# Patient Record
Sex: Male | Born: 1995 | Race: White | Hispanic: No | Marital: Single | Smoking: Current some day smoker
Health system: Southern US, Community
[De-identification: ages and names within clinical notes are randomized; demographics above are authoritative.]

## PROBLEM LIST (undated history)

## (undated) DIAGNOSIS — N200 Calculus of kidney: Secondary | ICD-10-CM

## (undated) DIAGNOSIS — Z87442 Personal history of urinary calculi: Secondary | ICD-10-CM

---

## 2018-09-17 ENCOUNTER — Encounter (HOSPITAL_COMMUNITY): Payer: Self-pay | Admitting: *Deleted

## 2018-09-17 ENCOUNTER — Inpatient Hospital Stay (HOSPITAL_COMMUNITY)
Admission: EM | Admit: 2018-09-17 | Discharge: 2018-09-20 | DRG: 197 | Disposition: A | Discharge status: Home / Self Care | Payer: Self-pay | Attending: Internal Medicine | Admitting: Internal Medicine

## 2018-09-17 ENCOUNTER — Emergency Department (HOSPITAL_COMMUNITY): Payer: Self-pay

## 2018-09-17 ENCOUNTER — Other Ambulatory Visit: Payer: Self-pay

## 2018-09-17 DIAGNOSIS — R0789 Other chest pain: Secondary | ICD-10-CM | POA: Diagnosis present

## 2018-09-17 DIAGNOSIS — E876 Hypokalemia: Secondary | ICD-10-CM | POA: Diagnosis present

## 2018-09-17 DIAGNOSIS — F1411 Cocaine abuse, in remission: Secondary | ICD-10-CM | POA: Diagnosis present

## 2018-09-17 DIAGNOSIS — Z8249 Family history of ischemic heart disease and other diseases of the circulatory system: Secondary | ICD-10-CM

## 2018-09-17 DIAGNOSIS — R042 Hemoptysis: Secondary | ICD-10-CM | POA: Diagnosis present

## 2018-09-17 DIAGNOSIS — K920 Hematemesis: Secondary | ICD-10-CM | POA: Diagnosis present

## 2018-09-17 DIAGNOSIS — Z590 Homelessness: Secondary | ICD-10-CM

## 2018-09-17 DIAGNOSIS — R768 Other specified abnormal immunological findings in serum: Secondary | ICD-10-CM | POA: Diagnosis present

## 2018-09-17 DIAGNOSIS — F1990 Other psychoactive substance use, unspecified, uncomplicated: Secondary | ICD-10-CM | POA: Diagnosis present

## 2018-09-17 DIAGNOSIS — F17200 Nicotine dependence, unspecified, uncomplicated: Secondary | ICD-10-CM | POA: Diagnosis present

## 2018-09-17 DIAGNOSIS — W548XXA Other contact with dog, initial encounter: Secondary | ICD-10-CM

## 2018-09-17 DIAGNOSIS — F1290 Cannabis use, unspecified, uncomplicated: Secondary | ICD-10-CM | POA: Diagnosis present

## 2018-09-17 DIAGNOSIS — F1310 Sedative, hypnotic or anxiolytic abuse, uncomplicated: Secondary | ICD-10-CM | POA: Diagnosis present

## 2018-09-17 DIAGNOSIS — J849 Interstitial pulmonary disease, unspecified: Principal | ICD-10-CM | POA: Diagnosis present

## 2018-09-17 DIAGNOSIS — F1910 Other psychoactive substance abuse, uncomplicated: Secondary | ICD-10-CM | POA: Diagnosis present

## 2018-09-17 DIAGNOSIS — R7989 Other specified abnormal findings of blood chemistry: Secondary | ICD-10-CM | POA: Diagnosis present

## 2018-09-17 DIAGNOSIS — R0602 Shortness of breath: Secondary | ICD-10-CM | POA: Diagnosis present

## 2018-09-17 DIAGNOSIS — R300 Dysuria: Secondary | ICD-10-CM | POA: Diagnosis present

## 2018-09-17 DIAGNOSIS — R740 Nonspecific elevation of levels of transaminase and lactic acid dehydrogenase [LDH]: Secondary | ICD-10-CM | POA: Diagnosis present

## 2018-09-17 DIAGNOSIS — Z87442 Personal history of urinary calculi: Secondary | ICD-10-CM

## 2018-09-17 DIAGNOSIS — F1110 Opioid abuse, uncomplicated: Secondary | ICD-10-CM | POA: Diagnosis present

## 2018-09-17 DIAGNOSIS — Y92481 Parking lot as the place of occurrence of the external cause: Secondary | ICD-10-CM

## 2018-09-17 DIAGNOSIS — Y939 Activity, unspecified: Secondary | ICD-10-CM

## 2018-09-17 HISTORY — DX: Calculus of kidney: N20.0

## 2018-09-17 HISTORY — DX: Personal history of urinary calculi: Z87.442

## 2018-09-17 MED ORDER — SODIUM CHLORIDE 0.9% FLUSH: 3.0000 mL | Freq: Once | INTRAVENOUS | Status: DC

## 2018-09-17 NOTE — ED Triage Notes (Signed)
Pt arrived by EMS for hematemsis after IV heroin use. Pt noted to be spitting up blood tinged sputum. C./o GI upset a sore throat.

## 2018-09-18 ENCOUNTER — Other Ambulatory Visit: Payer: Self-pay

## 2018-09-18 ENCOUNTER — Emergency Department (HOSPITAL_COMMUNITY): Payer: Self-pay

## 2018-09-18 ENCOUNTER — Encounter (HOSPITAL_COMMUNITY): Payer: Self-pay | Admitting: Emergency Medicine

## 2018-09-18 DIAGNOSIS — F1190 Opioid use, unspecified, uncomplicated: Secondary | ICD-10-CM | POA: Insufficient documentation

## 2018-09-18 DIAGNOSIS — F1110 Opioid abuse, uncomplicated: Secondary | ICD-10-CM | POA: Insufficient documentation

## 2018-09-18 DIAGNOSIS — E876 Hypokalemia: Secondary | ICD-10-CM | POA: Diagnosis present

## 2018-09-18 DIAGNOSIS — F1910 Other psychoactive substance abuse, uncomplicated: Secondary | ICD-10-CM | POA: Diagnosis present

## 2018-09-18 DIAGNOSIS — F1990 Other psychoactive substance use, unspecified, uncomplicated: Secondary | ICD-10-CM | POA: Diagnosis present

## 2018-09-18 DIAGNOSIS — R042 Hemoptysis: Secondary | ICD-10-CM

## 2018-09-18 LAB — CBC
HCT: 38.1 % — ABNORMAL LOW (ref 39.0–52.0)
HCT: 39.1 % (ref 39.0–52.0)
HCT: 39.8 % (ref 39.0–52.0)
HCT: 44 % (ref 39.0–52.0)
HCT: 52.2 % — ABNORMAL HIGH (ref 39.0–52.0)
Hemoglobin: 12.8 g/dL — ABNORMAL LOW (ref 13.0–17.0)
Hemoglobin: 13.1 g/dL (ref 13.0–17.0)
Hemoglobin: 13.5 g/dL (ref 13.0–17.0)
Hemoglobin: 15.1 g/dL (ref 13.0–17.0)
Hemoglobin: 17.9 g/dL — ABNORMAL HIGH (ref 13.0–17.0)
MCH: 30 pg (ref 26.0–34.0)
MCH: 30.3 pg (ref 26.0–34.0)
MCH: 30.3 pg (ref 26.0–34.0)
MCH: 30.4 pg (ref 26.0–34.0)
MCH: 31.3 pg (ref 26.0–34.0)
MCHC: 33.5 g/dL (ref 30.0–36.0)
MCHC: 33.6 g/dL (ref 30.0–36.0)
MCHC: 33.9 g/dL (ref 30.0–36.0)
MCHC: 34.3 g/dL (ref 30.0–36.0)
MCHC: 34.3 g/dL (ref 30.0–36.0)
MCV: 88.2 fL (ref 80.0–100.0)
MCV: 89.6 fL (ref 80.0–100.0)
MCV: 89.7 fL (ref 80.0–100.0)
MCV: 90.1 fL (ref 80.0–100.0)
MCV: 91.3 fL (ref 80.0–100.0)
NRBC: 0 % (ref 0.0–0.2)
PLATELETS: 235 10*3/uL (ref 150–400)
Platelets: 197 10*3/uL (ref 150–400)
Platelets: 218 10*3/uL (ref 150–400)
Platelets: 221 10*3/uL (ref 150–400)
Platelets: 274 10*3/uL (ref 150–400)
RBC: 4.23 MIL/uL (ref 4.22–5.81)
RBC: 4.36 MIL/uL (ref 4.22–5.81)
RBC: 4.44 MIL/uL (ref 4.22–5.81)
RBC: 4.99 MIL/uL (ref 4.22–5.81)
RBC: 5.72 MIL/uL (ref 4.22–5.81)
RDW: 12.1 % (ref 11.5–15.5)
RDW: 12.3 % (ref 11.5–15.5)
RDW: 12.5 % (ref 11.5–15.5)
RDW: 12.5 % (ref 11.5–15.5)
RDW: 12.6 % (ref 11.5–15.5)
WBC: 10.9 10*3/uL — ABNORMAL HIGH (ref 4.0–10.5)
WBC: 11.4 10*3/uL — ABNORMAL HIGH (ref 4.0–10.5)
WBC: 14.7 10*3/uL — ABNORMAL HIGH (ref 4.0–10.5)
WBC: 14.8 10*3/uL — AB (ref 4.0–10.5)
WBC: 15.5 10*3/uL — ABNORMAL HIGH (ref 4.0–10.5)
nRBC: 0 % (ref 0.0–0.2)
nRBC: 0 % (ref 0.0–0.2)
nRBC: 0 % (ref 0.0–0.2)
nRBC: 0 % (ref 0.0–0.2)

## 2018-09-18 LAB — DIFFERENTIAL
Basophils Absolute: 0 10*3/uL (ref 0.0–0.1)
Basophils Relative: 0 %
Eosinophils Absolute: 0 10*3/uL (ref 0.0–0.5)
Eosinophils Relative: 0 %
Lymphocytes Relative: 9 %
Lymphs Abs: 1 10*3/uL (ref 0.7–4.0)
Monocytes Absolute: 0.6 10*3/uL (ref 0.1–1.0)
Monocytes Relative: 5 %
Neutro Abs: 9.8 10*3/uL — ABNORMAL HIGH (ref 1.7–7.7)
Neutrophils Relative %: 86 %

## 2018-09-18 LAB — COMPREHENSIVE METABOLIC PANEL
ALT: 85 U/L — ABNORMAL HIGH (ref 0–44)
ANION GAP: 15 (ref 5–15)
AST: 65 U/L — ABNORMAL HIGH (ref 15–41)
Albumin: 3.9 g/dL (ref 3.5–5.0)
Alkaline Phosphatase: 67 U/L (ref 38–126)
BILIRUBIN TOTAL: 1.1 mg/dL (ref 0.3–1.2)
BUN: 10 mg/dL (ref 6–20)
CO2: 23 mmol/L (ref 22–32)
Calcium: 9.1 mg/dL (ref 8.9–10.3)
Chloride: 101 mmol/L (ref 98–111)
Creatinine, Ser: 0.73 mg/dL (ref 0.61–1.24)
GFR calc Af Amer: >60 mL/min (ref >60)
GFR calc non Af Amer: >60 mL/min (ref >60)
Glucose, Bld: 112 mg/dL — ABNORMAL HIGH (ref 70–99)
Potassium: 3.2 mmol/L — ABNORMAL LOW (ref 3.5–5.1)
Sodium: 139 mmol/L (ref 135–145)
TOTAL PROTEIN: 8 g/dL (ref 6.5–8.1)

## 2018-09-18 LAB — URINALYSIS, ROUTINE W REFLEX MICROSCOPIC
Bilirubin Urine: NEGATIVE
Glucose, UA: NEGATIVE mg/dL
Hgb urine dipstick: NEGATIVE
Ketones, ur: 20 mg/dL — AB
Nitrite: NEGATIVE
Protein, ur: NEGATIVE mg/dL
Specific Gravity, Urine: 1.04 — ABNORMAL HIGH (ref 1.005–1.030)
pH: 7 (ref 5.0–8.0)

## 2018-09-18 LAB — RAPID URINE DRUG SCREEN, HOSP PERFORMED
Amphetamines: NOT DETECTED
Amphetamines: NOT DETECTED
Barbiturates: NOT DETECTED
Barbiturates: NOT DETECTED
Benzodiazepines: NOT DETECTED
Benzodiazepines: NOT DETECTED
Cocaine: NOT DETECTED
Cocaine: NOT DETECTED
OPIATES: NOT DETECTED
OPIATES: NOT DETECTED
Tetrahydrocannabinol: NOT DETECTED
Tetrahydrocannabinol: NOT DETECTED

## 2018-09-18 LAB — PROTIME-INR
INR: 1
Prothrombin Time: 13.1 seconds (ref 11.4–15.2)

## 2018-09-18 LAB — TYPE AND SCREEN
ABO/RH(D): O POS
Antibody Screen: NEGATIVE

## 2018-09-18 LAB — APTT: APTT: 31 s (ref 24–36)

## 2018-09-18 LAB — ACETAMINOPHEN LEVEL: Acetaminophen (Tylenol), Serum: <10 ug/mL — ABNORMAL LOW (ref 10–30)

## 2018-09-18 LAB — SALICYLATE LEVEL: Salicylate Lvl: <7 mg/dL (ref 2.8–30.0)

## 2018-09-18 LAB — LIPASE, BLOOD: Lipase: 20 U/L (ref 11–51)

## 2018-09-18 LAB — ABO/RH: ABO/RH(D): O POS

## 2018-09-18 LAB — HIV ANTIBODY (ROUTINE TESTING W REFLEX): HIV Screen 4th Generation wRfx: NONREACTIVE

## 2018-09-18 MED ORDER — ONDANSETRON HCL 4 MG/2ML IJ SOLN
4.0000 mg | Freq: Once | INTRAMUSCULAR | Status: AC
  Administered 2018-09-18: 4 mg via INTRAVENOUS
  Filled 2018-09-18: qty 2

## 2018-09-18 MED ORDER — IOPAMIDOL (ISOVUE-370) INJECTION 76%
75.0000 mL | Freq: Once | INTRAVENOUS | Status: AC | PRN
  Administered 2018-09-18: 75 mL via INTRAVENOUS

## 2018-09-18 MED ORDER — TRAZODONE HCL 50 MG PO TABS: 50.0000 mg | ORAL_TABLET | Freq: Every evening | ORAL | Status: DC | PRN

## 2018-09-18 MED ORDER — POLYETHYLENE GLYCOL 3350 17 G PO PACK: 17.0000 g | PACK | Freq: Every day | ORAL | Status: DC | PRN

## 2018-09-18 MED ORDER — SODIUM CHLORIDE 0.9% FLUSH: 3.0000 mL | INTRAVENOUS | Status: DC | PRN

## 2018-09-18 MED ORDER — ACETAMINOPHEN 325 MG PO TABS
650.0000 mg | ORAL_TABLET | Freq: Four times a day (QID) | ORAL | Status: DC | PRN
  Administered 2018-09-18: 650 mg via ORAL
  Filled 2018-09-18: qty 2

## 2018-09-18 MED ORDER — GUAIFENESIN ER 600 MG PO TB12
600.0000 mg | ORAL_TABLET | Freq: Two times a day (BID) | ORAL | Status: DC
  Administered 2018-09-18 – 2018-09-19 (×4): 600 mg via ORAL
  Filled 2018-09-18 (×4): qty 1

## 2018-09-18 MED ORDER — SODIUM CHLORIDE 0.9 % IV SOLN
INTRAVENOUS | Status: DC
  Administered 2018-09-18: 05:00:00 via INTRAVENOUS

## 2018-09-18 MED ORDER — DM-GUAIFENESIN ER 30-600 MG PO TB12: 1.0000 | ORAL_TABLET | Freq: Two times a day (BID) | ORAL | Status: DC | PRN

## 2018-09-18 MED ORDER — IPRATROPIUM-ALBUTEROL 0.5-2.5 (3) MG/3ML IN SOLN
3.0000 mL | Freq: Four times a day (QID) | RESPIRATORY_TRACT | Status: DC
  Administered 2018-09-18 – 2018-09-19 (×5): 3 mL via RESPIRATORY_TRACT
  Filled 2018-09-18 (×5): qty 3

## 2018-09-18 MED ORDER — SODIUM CHLORIDE 0.9 % IV SOLN
INTRAVENOUS | Status: DC
  Administered 2018-09-18: 125 mL/h via INTRAVENOUS
  Administered 2018-09-18: via INTRAVENOUS
  Administered 2018-09-18: 1000 mL via INTRAVENOUS

## 2018-09-18 MED ORDER — SODIUM CHLORIDE 0.9% FLUSH
3.0000 mL | Freq: Two times a day (BID) | INTRAVENOUS | Status: DC
  Administered 2018-09-18: 3 mL via INTRAVENOUS

## 2018-09-18 MED ORDER — TRAZODONE HCL 50 MG PO TABS: 150.0000 mg | ORAL_TABLET | Freq: Every evening | ORAL | Status: DC | PRN

## 2018-09-18 MED ORDER — SODIUM CHLORIDE 0.9 % IV BOLUS
1000.0000 mL | Freq: Once | INTRAVENOUS | Status: DC
  Administered 2018-09-18: 1000 mL via INTRAVENOUS

## 2018-09-18 MED ORDER — PHENOL 1.4 % MT LIQD
1.0000 | OROMUCOSAL | Status: DC | PRN
  Administered 2018-09-18: 1 via OROMUCOSAL
  Filled 2018-09-18: qty 177

## 2018-09-18 MED ORDER — ONDANSETRON HCL 4 MG PO TABS: 4.0000 mg | ORAL_TABLET | Freq: Four times a day (QID) | ORAL | Status: DC | PRN

## 2018-09-18 MED ORDER — GABAPENTIN 300 MG PO CAPS
300.0000 mg | ORAL_CAPSULE | Freq: Three times a day (TID) | ORAL | Status: DC
  Administered 2018-09-18 – 2018-09-19 (×6): 300 mg via ORAL
  Filled 2018-09-18 (×6): qty 1

## 2018-09-18 MED ORDER — PANTOPRAZOLE SODIUM 40 MG IV SOLR
40.0000 mg | Freq: Once | INTRAVENOUS | Status: AC
  Administered 2018-09-18: 40 mg via INTRAVENOUS
  Filled 2018-09-18: qty 40

## 2018-09-18 MED ORDER — METHOCARBAMOL 750 MG PO TABS
750.0000 mg | ORAL_TABLET | Freq: Four times a day (QID) | ORAL | Status: DC
  Administered 2018-09-18 – 2018-09-19 (×7): 750 mg via ORAL
  Filled 2018-09-18: qty 1
  Filled 2018-09-18: qty 2
  Filled 2018-09-18 (×5): qty 1

## 2018-09-18 MED ORDER — ONDANSETRON HCL 4 MG/2ML IJ SOLN: 4.0000 mg | Freq: Four times a day (QID) | INTRAMUSCULAR | Status: DC | PRN

## 2018-09-18 MED ORDER — ACETAMINOPHEN 650 MG RE SUPP: 650.0000 mg | Freq: Four times a day (QID) | RECTAL | Status: DC | PRN

## 2018-09-18 MED ORDER — ALBUTEROL SULFATE (2.5 MG/3ML) 0.083% IN NEBU: 2.5000 mg | INHALATION_SOLUTION | RESPIRATORY_TRACT | Status: DC | PRN

## 2018-09-18 MED ORDER — SODIUM CHLORIDE 0.9 % IV SOLN: 250.0000 mL | INTRAVENOUS | Status: DC | PRN

## 2018-09-18 MED ORDER — ALBUTEROL SULFATE (2.5 MG/3ML) 0.083% IN NEBU: 2.5000 mg | INHALATION_SOLUTION | Freq: Four times a day (QID) | RESPIRATORY_TRACT | Status: DC | PRN

## 2018-09-18 MED ORDER — POTASSIUM CHLORIDE 20 MEQ/15ML (10%) PO SOLN
40.0000 meq | Freq: Once | ORAL | Status: AC
  Administered 2018-09-18: 40 meq via ORAL
  Filled 2018-09-18: qty 30

## 2018-09-18 MED ORDER — HYDROXYZINE HCL 25 MG PO TABS
25.0000 mg | ORAL_TABLET | Freq: Three times a day (TID) | ORAL | Status: DC
  Administered 2018-09-18 – 2018-09-19 (×6): 25 mg via ORAL
  Filled 2018-09-18 (×6): qty 1

## 2018-09-18 MED ORDER — IOPAMIDOL (ISOVUE-370) INJECTION 76%
INTRAVENOUS | Status: AC
  Filled 2018-09-18: qty 100

## 2018-09-18 NOTE — ED Notes (Signed)
Breakfast tay ordered

## 2018-09-18 NOTE — Consult Note (Signed)
PCCM CONSULT NOTE    NAME:  Joshua Tucker, MRN:  [id number], DOB:  1996-02-25, LOS: 0 ADMISSION DATE:  09/17/2018, CONSULTATION DATE:  09/18/2018 REFERRING MD:  Randal Buba MD EDP, CHIEF COMPLAINT:  Coughing up Blood   Brief History   23 yr old homeless male who admits to polysubstance abuse presents from EMS after using IV heroin and noted to be coughing up blood. PCCM consulted for possible admission  History of present illness   23 yr old M who is homeless who reports only medical history of nephrolithiasis and presents to [facility] via EMS. He states that he was behind the [place] when he used IV heroin on 09/17/2018 after 8 pm.  He states that he passed out and came through when his 70 lb [employer] mix jumped on the right side of his chest. After which he felt chest pain and began to cough up some blood.  His friend then suggested he go to the ER. Prior to this he admits to using marijuana (smoking), snorting Xanax and Roxicodone (which he gets from someone who has a prescription) he denies alcohol use, cocaine or methamphetamines. Other symptoms include nausea and vomiting, and painful urination.  He denies fevers, nose bleeds, cough prior to this evening,  During our interview he asked if he could be tested for Hepatitis.   Past Medical History  .Marland KitchenNephrolithiasis Active Ambulatory Problems    Diagnosis Date Noted  . Hemoptysis   . Heroin abuse ([place])   . Narcotic drug use   . IVDU (intravenous drug user)    Resolved Ambulatory Problems    Diagnosis Date Noted  . No Resolved Ambulatory Problems   Past Medical History:  Diagnosis Date  . Kidney stones      Significant Hospital Events   Hemoptysis small volume <50cc  Consults:  PCCM- 09/18/2018  Procedures:  None to date  Significant Diagnostic Tests:  UDS negative K+ 3.2 Anion Gap 15 Hgb 17.9 Hct 52.2 WBC 11,4 Plts 274    Micro Data:  No cultures No RVP or flu panel  Antimicrobials:  Not on Antibiotics    Objective   Blood  pressure 125/81, pulse (!) 113, temperature 98.1 F (36.7 C), resp. rate (!) 28, SpO2 93 %.        Intake/Output Summary (Last 24 hours) at 09/18/2018 0313 Last data filed at 09/18/2018 0125 Gross per 24 hour  Intake 1000 ml  Output -  Net 1000 ml   There were no vitals filed for this visit.  Examination: General: in no acute distress  HENT: normocephalic atraumatic with nasal cannula in nares no flaring no evidence of bleeding in the nares Lungs: fine diffuse crackles no exp wheezing, + cough on deep inspiration. Tenderness on palpation of right anterior chest wall. Scratches on chest noted Cardiovascular: S1 and S2 tachy Abdomen: soft scaphoid not distended not tender Extremities: PROM and AROM in tact no edema noted Neuro: AAOx3    Assessment & Plan:  1. Hemoptysis small volume post polysubstance abuse and possible trauma from dog jumping on chest.- no evidence of rib fractures or contusion on recent imaging. Pt does have bilateral GGO infiltrates suggestive of Interstitial lung disease DDx:  1. Pneumonia- unlikely as pt has had no h/o fevers, chills, and no previous productive cough prior to this evening. He does not report having the flu shot, he is homeless but does not report sick contacts.  Would continue to monitor and no need for antibiotics at this time.  1. Alveolitis -  most likely as pt admits to history of inhalation of Roxicodone. Denies inhalation of cocaine/crack. Interestingly his UDS is negative. Continue on supplemental oxygen.   2. Pulmonary hemorrhage: diffuse hemorrhage less likely, small volume of hemoptysis, pt not actively SOB, no increased work of breathing, not requiring increasing oxygen supply or mechanical ventilation, hemodynamically stable and Hgb stable.. Plan: no need for emergent bronchoscopy with lavage (w/ serial aliquots) for diagnosis. No indication for steroids at this point. Check UA- for proteinuria  Continue monitoring continue on  supplemental O2 Bronchodilators Q 6 hrs prn SOB  If pt continues to have persistent hemoptysis or worsening resp status he may warrant a Bronchoscopy with BAL for further diagnosis.   Would recommend  checking HIV panel given history IVDU  2. Hypokalemia : replace potassium for goal level 4.0 3. Transaminitis noted: AST 65 ALT 85 Alk phos 67 Lipase 20- has some nausea and vomiting. + IV drug history with possible needle sharing-  recommend checking Hepatitis panel  4. Painful urination: per patient - Plan: recommend STI screen  Best practice:  Diet: Regular diet as tolerated Pain/Anxiety/Delirium protocol (if indicated VAP protocol (if indicated): not indicated DVT prophylaxis: Lovenox [place] GI prophylaxis: not indicated Glucose control: no h/o DM if BG exceeds '180mg'$ /dl consider sliding scale Mobility: Bedrest Code Status: FULL Family Communication: not at bedside at time of evaluation Disposition: At the time of my evaluation pt is hemodynamically stable and does not require ICU admission or mgmt.  Given the GGO on imaging and his history of hemoptysis we will follow as Pulmonary consult.   Labs   CBC: Recent Labs  Lab 09/17/18 2347  WBC 11.4*  NEUTROABS 9.8*  HGB 17.9*  HCT 52.2*  MCV 91.3  PLT 789    Basic Metabolic Panel: Recent Labs  Lab 09/17/18 2347  NA 139  K 3.2*  CL 101  CO2 23  GLUCOSE 112*  BUN 10  CREATININE 0.73  CALCIUM 9.1   GFR: CrCl cannot be calculated (Unknown ideal weight.). Recent Labs  Lab 09/17/18 2347  WBC 11.4*    Liver Function Tests: Recent Labs  Lab 09/17/18 2347  AST 65*  ALT 85*  ALKPHOS 67  BILITOT 1.1  PROT 8.0  ALBUMIN 3.9   Recent Labs  Lab 09/17/18 2347  LIPASE 20   No results for input(s): AMMONIA in the last 168 hours.  ABG No results found for: PHART, PCO2ART, PO2ART, HCO3, TCO2, ACIDBASEDEF, O2SAT   Coagulation Profile: Recent Labs  Lab 09/18/18 0031  INR 1.00    Cardiac Enzymes: No results for  input(s): CKTOTAL, CKMB, CKMBINDEX, TROPONINI in the last 168 hours.  HbA1C: No results found for: HGBA1C  CBG: No results for input(s): GLUCAP in the last 168 hours.  Review of Systems:   Marland KitchenMarland KitchenReview of Systems  Constitutional: Negative for chills and fever.  HENT: Negative for congestion, nosebleeds, sinus pain and sore throat.   Eyes: Negative.   Respiratory: Positive for hemoptysis. Negative for shortness of breath.   Cardiovascular: Positive for chest pain. Negative for leg swelling.  Gastrointestinal: Positive for nausea and vomiting. Negative for abdominal pain.  Genitourinary: Positive for dysuria. Negative for flank pain and urgency.  Musculoskeletal: Negative.   Skin: Negative.   Neurological: Negative.   Endo/Heme/Allergies: Negative.      Past Medical History  He,  has a past medical history of Kidney stones.   Surgical History   History reviewed. No pertinent surgical history.   Social History   reports  that he has been smoking. He has never used smokeless tobacco. He reports current alcohol use. He reports current drug use. Drugs: IV and Marijuana.   Family History   His family history is not on file.   Allergies No Known Allergies   Home Medications  Prior to Admission medications   Not on File     Critical care time: 45 mins Pulmonary Consult     Signed Dr Seward Carol Pulmonary Critical Care Locums

## 2018-09-18 NOTE — H&P (Addendum)
Patient Demographics:    Joshua Tucker, is a 23 y.o. male  MRN: [id number]   DOB - April 17, 1996  Admit Date - 09/17/2018  Outpatient Primary MD for the patient is Patient, No Pcp Per   Assessment & Plan:    Principal Problem:   Hemoptysis Active Problems:   Hypokalemia   IVDU (intravenous drug user)   Polysubstance abuse (HCC)   1)Hemoptysis---- Alveolitis Versus Pulmonary Hemorrhage Versus PNA, suspect inhalation injury from snorting street drugs compounded by mechanical trauma of his 70 pound dog jumping on his chest----CTA chest findings noted with bilateral GGO infiltrates suggestive of interstitial lung disease--- bacteria pneumonia less likely, pulmonary consult appreciated, they recommend holding off on antibiotics and steroids at this time--continue bronchodilators, mucolytics and supplemental oxygen,.... Hopefully patient will not need bronchoscopy with lavage  2)Polysubstance Abuse----- He admits to inhalation of opiates (Roxicodone) and xanax, also admits to IV heroin use-----however- urine drug screen essentially negative..... Patient readily admits to polysubstance abuse including prior history of methamphetamine, cocaine and other street drug use-----treat empirically with methocarbamol, gabapentin and hydroxyzine to try to blunt effects of drug withdrawal--- HIV testing pending given history of IVDU  3)Elevated LFTs--- ALT higher than AST, given history of IVDU viral hepatitis profile is pending  4)History of dysuria--- patient will need GC chlamydia testing  5)HypoKalemia--- replace and recheck  With History of - Reviewed by me  Past Medical History:  Diagnosis Date  . Kidney stones       History reviewed. No pertinent surgical history.    Chief Complaint  Patient presents with  .  Hematemesis      HPI:    Joshua Tucker  is a 23 y.o. male who is originally from [place] (now homeless) with past medical history of polysubstance abuse including opiates (iv heroin as well as inhalation of Roxicodone ) presents with concerns about hemoptysis, No fever  Or chills ,  No Nausea, Vomiting or Diarrhea Patient apparently used IV heroin after 8 PM on 09/17/2018--- he then fell asleep, his 70 pound dog jumped on his right side of the chest after a while he woke up with chest discomfort and started to cough up some blood..... No URI symptoms per se  Patient also admits to smoking marijuana, admits to snorting Xanax and Roxicodone...  He does have a history of cocaine and methamphetamine use (he denies using either) at this time  In ED----UDS is negative,   CTA Chest--IMPRESSION: 1. Nodular ground-glass opacities throughout bilateral lungs bilaterally which may reflect alveolitis versus pulmonary hemorrhage versus less likely pulmonary edema.  Elevated LFTs noted, patient complains of some dysuria   pulmonary consult appreciated   Review of systems:    In addition to the HPI above,   A full Review of  Systems was done, all other systems reviewed are negative except as noted above in HPI , .    Social History:  Reviewed by me    Social  History   Tobacco Use  . Smoking status: Current Some Day Smoker  . Smokeless tobacco: Never Used  Substance Use Topics  . Alcohol use: Yes      Family History :  Reviewed by me  HTN   Home Medications:   Prior to Admission medications   Not on File     Allergies:    No Known Allergies   Physical Exam:   Vitals  Blood pressure 116/70, pulse 95, temperature 98.1 F (36.7 C), resp. rate 20, SpO2 96 %.  Physical Examination: General appearance - alert, unkept, and in no distress  Mental status - alert, oriented to person, place, and time,  Eyes - sclera anicteric Nose- [place] 3 L/min Neck - supple, no JVD elevation  , Chest -mostly clear  to auscultation bilaterally, diminished in bases, no wheezing no rales, no rhonchi  heart - S1 and S2 normal, regular  Abdomen - soft, nontender, nondistended, no masses or organomegaly Neurological - screening mental status exam normal, neck supple without rigidity, cranial nerves II through XII intact, DTR's normal and symmetric Extremities - no pedal edema noted, intact peripheral pulses  Skin - warm, dry    Data Review:    CBC Recent Labs  Lab 09/17/18 2347 09/18/18 0315  WBC 11.4* 15.5*  HGB 17.9* 15.1  HCT 52.2* 44.0  PLT 274 235  MCV 91.3 88.2  MCH 31.3 30.3  MCHC 34.3 34.3  RDW 12.3 12.1  LYMPHSABS 1.0  --   MONOABS 0.6  --   EOSABS 0.0  --   BASOSABS 0.0  --    ------------------------------------------------------------------------------------------------------------------  Chemistries  Recent Labs  Lab 09/17/18 2347  NA 139  K 3.2*  CL 101  CO2 23  GLUCOSE 112*  BUN 10  CREATININE 0.73  CALCIUM 9.1  AST 65*  ALT 85*  ALKPHOS 67  BILITOT 1.1   ------------------------------------------------------------------------------------------------------------------ CrCl cannot be calculated (Unknown ideal weight.). ------------------------------------------------------------------------------------------------------------------ No results for input(s): TSH, T4TOTAL, T3FREE, THYROIDAB in the last 72 hours.  Invalid input(s): FREET3   Coagulation profile Recent Labs  Lab 09/18/18 0031  INR 1.00   ------------------------------------------------------------------------------------------------------------------- No results for input(s): DDIMER in the last 72 hours. -------------------------------------------------------------------------------------------------------------------  Cardiac Enzymes No results for input(s): CKMB, TROPONINI, MYOGLOBIN in the last 168 hours.  Invalid input(s):  CK ------------------------------------------------------------------------------------------------------------------ No results found for: BNP   ---------------------------------------------------------------------------------------------------------------  Urinalysis    Component Value Date/Time   COLORURINE YELLOW 09/18/2018 0522   APPEARANCEUR CLEAR 09/18/2018 0522   LABSPEC 1.040 (H) 09/18/2018 0522   PHURINE 7.0 09/18/2018 0522   GLUCOSEU NEGATIVE 09/18/2018 0522   HGBUR NEGATIVE 09/18/2018 0522   BILIRUBINUR NEGATIVE 09/18/2018 0522   KETONESUR 20 (A) 09/18/2018 0522   PROTEINUR NEGATIVE 09/18/2018 0522   NITRITE NEGATIVE 09/18/2018 0522   LEUKOCYTESUR TRACE (A) 09/18/2018 0522    ----------------------------------------------------------------------------------------------------------------   Imaging Results:    Dg Neck Soft Tissue  Result Date: 09/18/2018 CLINICAL DATA:  23 year old male with hematemesis after IV heroin use. Sore throat and shortness of breath. EXAM: NECK SOFT TISSUES - 1+ VIEW COMPARISON:  None. FINDINGS: Normal prevertebral soft tissue contour. Other neck soft tissue contours are within normal limits. No osseous abnormality identified. Visualized tracheal air column is within normal limits. Abnormal lung apices with reticulonodular density which is greater on the left. No apical pneumothorax. IMPRESSION: 1. Abnormal lung apices with reticulonodular density greater on the left. 2. Negative radiographic appearance of the neck. Electronically Signed   By: Althea GrimmerH  Hall M.D.  On: 09/18/2018 00:09   Dg Chest 2 View  Result Date: 09/18/2018 CLINICAL DATA:  23 year old male with hematemesis after IV heroin use. Sore throat and shortness of breath. EXAM: CHEST - 2 VIEW COMPARISON:  Neck radiographs today reported separately. FINDINGS: Normal lung volumes. Widespread abnormal left lung opacity with a somewhat mixed interstitial and airspace quality. Right upper lung  predominant lesser reticulonodular opacity. No superimposed pneumothorax, pleural effusion. Normal cardiac size and mediastinal contours. Visualized tracheal air column is within normal limits. No acute osseous abnormality identified. Negative visible bowel gas pattern. IMPRESSION: Asymmetric nonspecific pulmonary opacity, greater on the left, with no associated pneumothorax or pleural effusion. In this clinical setting top differential considerations include: Pulmonary talcosis, asymmetric pulmonary edema, or pulmonary hemorrhage. Electronically Signed   By: Odessa Fleming M.D.   On: 09/18/2018 00:13   Ct Angio Chest Pe W And/or Wo Contrast  Result Date: 09/18/2018 CLINICAL DATA:  Hemoptysis after IV heroin use EXAM: CT ANGIOGRAPHY CHEST WITH CONTRAST TECHNIQUE: Multidetector CT imaging of the chest was performed using the standard protocol during bolus administration of intravenous contrast. Multiplanar CT image reconstructions and MIPs were obtained to evaluate the vascular anatomy. CONTRAST:  33mL ISOVUE-370 IOPAMIDOL (ISOVUE-370) INJECTION 76% COMPARISON:  None. FINDINGS: Cardiovascular: Satisfactory opacification of the pulmonary arteries to the segmental level. No evidence of pulmonary embolism. Normal heart size. No pericardial effusion. Mediastinum/Nodes: Choose Lungs/Pleura: Nodular ground-glass opacities throughout bilateral lungs bilaterally which may reflect alveolitis versus pulmonary hemorrhage versus less likely pulmonary edema. No pleural effusion or pneumothorax. Upper Abdomen: No acute abnormality. Musculoskeletal: No chest wall abnormality. No acute or significant osseous findings. Review of the MIP images confirms the above findings. IMPRESSION: 1. Nodular ground-glass opacities throughout bilateral lungs bilaterally which may reflect alveolitis versus pulmonary hemorrhage versus less likely pulmonary edema. Electronically Signed   By: Elige Ko   On: 09/18/2018 01:16    Radiological Exams on  Admission: Dg Neck Soft Tissue  Result Date: 09/18/2018 CLINICAL DATA:  23 year old male with hematemesis after IV heroin use. Sore throat and shortness of breath. EXAM: NECK SOFT TISSUES - 1+ VIEW COMPARISON:  None. FINDINGS: Normal prevertebral soft tissue contour. Other neck soft tissue contours are within normal limits. No osseous abnormality identified. Visualized tracheal air column is within normal limits. Abnormal lung apices with reticulonodular density which is greater on the left. No apical pneumothorax. IMPRESSION: 1. Abnormal lung apices with reticulonodular density greater on the left. 2. Negative radiographic appearance of the neck. Electronically Signed   By: Odessa Fleming M.D.   On: 09/18/2018 00:09   Dg Chest 2 View  Result Date: 09/18/2018 CLINICAL DATA:  23 year old male with hematemesis after IV heroin use. Sore throat and shortness of breath. EXAM: CHEST - 2 VIEW COMPARISON:  Neck radiographs today reported separately. FINDINGS: Normal lung volumes. Widespread abnormal left lung opacity with a somewhat mixed interstitial and airspace quality. Right upper lung predominant lesser reticulonodular opacity. No superimposed pneumothorax, pleural effusion. Normal cardiac size and mediastinal contours. Visualized tracheal air column is within normal limits. No acute osseous abnormality identified. Negative visible bowel gas pattern. IMPRESSION: Asymmetric nonspecific pulmonary opacity, greater on the left, with no associated pneumothorax or pleural effusion. In this clinical setting top differential considerations include: Pulmonary talcosis, asymmetric pulmonary edema, or pulmonary hemorrhage. Electronically Signed   By: Odessa Fleming M.D.   On: 09/18/2018 00:13   Ct Angio Chest Pe W And/or Wo Contrast  Result Date: 09/18/2018 CLINICAL DATA:  Hemoptysis after IV heroin use  EXAM: CT ANGIOGRAPHY CHEST WITH CONTRAST TECHNIQUE: Multidetector CT imaging of the chest was performed using the standard protocol  during bolus administration of intravenous contrast. Multiplanar CT image reconstructions and MIPs were obtained to evaluate the vascular anatomy. CONTRAST:  75mL ISOVUE-370 IOPAMIDOL (ISOVUE-370) INJECTION 76% COMPARISON:  None. FINDINGS: Cardiovascular: Satisfactory opacification of the pulmonary arteries to the segmental level. No evidence of pulmonary embolism. Normal heart size. No pericardial effusion. Mediastinum/Nodes: Choose Lungs/Pleura: Nodular ground-glass opacities throughout bilateral lungs bilaterally which may reflect alveolitis versus pulmonary hemorrhage versus less likely pulmonary edema. No pleural effusion or pneumothorax. Upper Abdomen: No acute abnormality. Musculoskeletal: No chest wall abnormality. No acute or significant osseous findings. Review of the MIP images confirms the above findings. IMPRESSION: 1. Nodular ground-glass opacities throughout bilateral lungs bilaterally which may reflect alveolitis versus pulmonary hemorrhage versus less likely pulmonary edema. Electronically Signed   By: Elige Ko   On: 09/18/2018 01:16   DVT Prophylaxis -SCD   AM Labs Ordered, also please review Full Orders  Family Communication: Admission, patients condition and plan of care including tests being ordered have been discussed with the patient  who indicate understanding and agree with the plan   Code Status - Full Code  Likely DC to  Home (homeless)  Condition   stable  Shon Hale M.D on 09/18/2018 at 1:10 PM Go to [email or web address] -  for contact info  [facility] - Office  [phone]

## 2018-09-18 NOTE — Progress Notes (Signed)
Patient trasfered from ED to 5W11 via stretcher; alert and oriented x 4; no complaints of pain; IV saline locked in LAC - fluids started at 125 cc/hr per order; skin intact. Orient patient to room and unit; gave patient care guide; instructed how to use the call bell and  fall risk precautions. Will continue to monitor the patient.

## 2018-09-18 NOTE — ED Provider Notes (Signed)
[facility] Provider Note   CSN: [id number] Arrival date & time: 09/17/18  2314     History   Chief Complaint Chief Complaint  Patient presents with  . Hematemesis    HPI Joshua Tucker is a 23 y.o. male.  The history is provided by the patient.  Emesis  Severity:  Mild Timing:  Rare Number of daily episodes:  2 Quality:  Stomach contents and bright red blood Progression:  Resolved Chronicity:  New Recent urination:  Normal Relieved by:  Nothing Exacerbated by: shooting up heroin just prior to incident. Ineffective treatments:  None tried Associated symptoms: cough   Associated symptoms: no abdominal pain, no arthralgias, no chills, no diarrhea, no fever, no headaches, no myalgias and no URI   Risk factors: no alcohol use   Patient with h/o heroin abuse states he injected heroin and began coughing up blood.  Then had emesis.  No f/c/r.  No weakness no numbness.  No back or neck pain.    Past Medical History:  Diagnosis Date  . Kidney stones     There are no active problems to display for this patient.   History reviewed. No pertinent surgical history.      Home Medications    Prior to Admission medications   Not on File    Family History No family history on file.  Social History Social History   Tobacco Use  . Smoking status: Current Some Day Smoker  . Smokeless tobacco: Never Used  Substance Use Topics  . Alcohol use: Yes  . Drug use: Yes    Types: IV, Marijuana    Comment: heroin     Allergies   Patient has no known allergies.   Review of Systems Review of Systems  Constitutional: Negative for chills, diaphoresis and fever.  HENT: Negative for drooling, trouble swallowing and voice change.   Eyes: Negative for photophobia.  Respiratory: Positive for cough. Negative for wheezing.   Cardiovascular: Negative for chest pain, palpitations and leg swelling.  Gastrointestinal: Positive for nausea and  vomiting. Negative for abdominal pain, blood in stool and diarrhea.  Genitourinary: Negative for flank pain.  Musculoskeletal: Negative for arthralgias, myalgias, neck pain and neck stiffness.  Neurological: Negative for headaches.  All other systems reviewed and are negative.    Physical Exam Updated Vital Signs BP 132/90   Pulse 98   Temp 98.1 F (36.7 C)   Resp 16   SpO2 91%   Physical Exam Vitals signs and nursing note reviewed.  Constitutional:      General: He is not in acute distress.    Appearance: Normal appearance. He is normal weight.  HENT:     Head: Normocephalic and atraumatic.     Nose: Nose normal.     Mouth/Throat:     Mouth: Mucous membranes are moist.     Pharynx: Oropharynx is clear.  Eyes:     Conjunctiva/sclera: Conjunctivae normal.     Pupils: Pupils are equal, round, and reactive to light.  Neck:     Musculoskeletal: Normal range of motion and neck supple. No neck rigidity or muscular tenderness.     Comments: No crepitance Cardiovascular:     Rate and Rhythm: Regular rhythm. Tachycardia present.     Pulses: Normal pulses.     Heart sounds: Normal heart sounds.  Pulmonary:     Effort: Pulmonary effort is normal. Tachypnea present. No respiratory distress.     Breath sounds: No stridor. Rhonchi present.  No wheezing or rales.  Chest:     Chest wall: No tenderness.  Abdominal:     General: Abdomen is flat. Bowel sounds are normal.     Tenderness: There is no abdominal tenderness. There is no guarding or rebound.     Hernia: No hernia is present.  Musculoskeletal: Normal range of motion.        General: No swelling or tenderness.  Lymphadenopathy:     Cervical: No cervical adenopathy.  Skin:    General: Skin is warm and dry.     Capillary Refill: Capillary refill takes less than 2 seconds.  Neurological:     General: No focal deficit present.     Mental Status: He is alert and oriented to person, place, and time.  Psychiatric:        Mood  and Affect: Mood normal.        Behavior: Behavior normal.      ED Treatments / Results  Labs (all labs ordered are listed, but only abnormal results are displayed) Results for orders placed or performed during the hospital encounter of 09/17/18  Lipase, blood  Result Value Ref Range   Lipase 20 11 - 51 U/L  Comprehensive metabolic panel  Result Value Ref Range   Sodium 139 135 - 145 mmol/L   Potassium 3.2 (L) 3.5 - 5.1 mmol/L   Chloride 101 98 - 111 mmol/L   CO2 23 22 - 32 mmol/L   Glucose, Bld 112 (H) 70 - 99 mg/dL   BUN 10 6 - 20 mg/dL   Creatinine, Ser 1.61 0.61 - 1.24 mg/dL   Calcium 9.1 8.9 - 09.6 mg/dL   Total Protein 8.0 6.5 - 8.1 g/dL   Albumin 3.9 3.5 - 5.0 g/dL   AST 65 (H) 15 - 41 U/L   ALT 85 (H) 0 - 44 U/L   Alkaline Phosphatase 67 38 - 126 U/L   Total Bilirubin 1.1 0.3 - 1.2 mg/dL   GFR calc non Af Amer >60 >60 mL/min   GFR calc Af Amer >60 >60 mL/min   Anion gap 15 5 - 15  CBC  Result Value Ref Range   WBC 11.4 (H) 4.0 - 10.5 K/uL   RBC 5.72 4.22 - 5.81 MIL/uL   Hemoglobin 17.9 (H) 13.0 - 17.0 g/dL   HCT 04.5 (H) 40.9 - 81.1 %   MCV 91.3 80.0 - 100.0 fL   MCH 31.3 26.0 - 34.0 pg   MCHC 34.3 30.0 - 36.0 g/dL   RDW 91.4 78.2 - 95.6 %   Platelets 274 150 - 400 K/uL   nRBC 0.0 0.0 - 0.2 %  Differential  Result Value Ref Range   Neutrophils Relative % 86 %   Neutro Abs 9.8 (H) 1.7 - 7.7 K/uL   Lymphocytes Relative 9 %   Lymphs Abs 1.0 0.7 - 4.0 K/uL   Monocytes Relative 5 %   Monocytes Absolute 0.6 0.1 - 1.0 K/uL   Eosinophils Relative 0 %   Eosinophils Absolute 0.0 0.0 - 0.5 K/uL   Basophils Relative 0 %   Basophils Absolute 0.0 0.0 - 0.1 K/uL  Rapid urine drug screen (hospital performed)  Result Value Ref Range   Opiates NONE DETECTED NONE DETECTED   Cocaine NONE DETECTED NONE DETECTED   Benzodiazepines NONE DETECTED NONE DETECTED   Amphetamines NONE DETECTED NONE DETECTED   Tetrahydrocannabinol NONE DETECTED NONE DETECTED   Barbiturates  NONE DETECTED NONE DETECTED  Acetaminophen level  Result Value Ref  Range   Acetaminophen (Tylenol), Serum <10 (L) 10 - 30 ug/mL  Salicylate level  Result Value Ref Range   Salicylate Lvl <7.0 2.8 - 30.0 mg/dL  Protime-INR  Result Value Ref Range   Prothrombin Time 13.1 11.4 - 15.2 seconds   INR 1.00   Type and screen  Result Value Ref Range   ABO/RH(D) O POS    Antibody Screen NEG    Sample Expiration      09/21/2018 Performed at [facility], [address], [place], [place] [address]   ABO/Rh  Result Value Ref Range   ABO/RH(D)      O POS Performed at [facility], [address], [place], [place] [address]    Dg Neck Soft Tissue  Result Date: 09/18/2018 CLINICAL DATA:  23 year old male with hematemesis after IV heroin use. Sore throat and shortness of breath. EXAM: NECK SOFT TISSUES - 1+ VIEW COMPARISON:  None. FINDINGS: Normal prevertebral soft tissue contour. Other neck soft tissue contours are within normal limits. No osseous abnormality identified. Visualized tracheal air column is within normal limits. Abnormal lung apices with reticulonodular density which is greater on the left. No apical pneumothorax. IMPRESSION: 1. Abnormal lung apices with reticulonodular density greater on the left. 2. Negative radiographic appearance of the neck. Electronically Signed   By: Odessa Fleming M.D.   On: 09/18/2018 00:09   Dg Chest 2 View  Result Date: 09/18/2018 CLINICAL DATA:  23 year old male with hematemesis after IV heroin use. Sore throat and shortness of breath. EXAM: CHEST - 2 VIEW COMPARISON:  Neck radiographs today reported separately. FINDINGS: Normal lung volumes. Widespread abnormal left lung opacity with a somewhat mixed interstitial and airspace quality. Right upper lung predominant lesser reticulonodular opacity. No superimposed pneumothorax, pleural effusion. Normal cardiac size and mediastinal contours. Visualized tracheal air column is within normal limits. No acute  osseous abnormality identified. Negative visible bowel gas pattern. IMPRESSION: Asymmetric nonspecific pulmonary opacity, greater on the left, with no associated pneumothorax or pleural effusion. In this clinical setting top differential considerations include: Pulmonary talcosis, asymmetric pulmonary edema, or pulmonary hemorrhage. Electronically Signed   By: Odessa Fleming M.D.   On: 09/18/2018 00:13   Ct Angio Chest Pe W And/or Wo Contrast  Result Date: 09/18/2018 CLINICAL DATA:  Hemoptysis after IV heroin use EXAM: CT ANGIOGRAPHY CHEST WITH CONTRAST TECHNIQUE: Multidetector CT imaging of the chest was performed using the standard protocol during bolus administration of intravenous contrast. Multiplanar CT image reconstructions and MIPs were obtained to evaluate the vascular anatomy. CONTRAST:  20mL ISOVUE-370 IOPAMIDOL (ISOVUE-370) INJECTION 76% COMPARISON:  None. FINDINGS: Cardiovascular: Satisfactory opacification of the pulmonary arteries to the segmental level. No evidence of pulmonary embolism. Normal heart size. No pericardial effusion. Mediastinum/Nodes: Choose Lungs/Pleura: Nodular ground-glass opacities throughout bilateral lungs bilaterally which may reflect alveolitis versus pulmonary hemorrhage versus less likely pulmonary edema. No pleural effusion or pneumothorax. Upper Abdomen: No acute abnormality. Musculoskeletal: No chest wall abnormality. No acute or significant osseous findings. Review of the MIP images confirms the above findings. IMPRESSION: 1. Nodular ground-glass opacities throughout bilateral lungs bilaterally which may reflect alveolitis versus pulmonary hemorrhage versus less likely pulmonary edema. Electronically Signed   By: Elige Ko   On: 09/18/2018 01:16    EKG EKG Interpretation  Date/Time:  Tuesday September 17 2018 23:14:49 EST Ventricular Rate:  107 PR Interval:  120 QRS Duration: 86 QT Interval:  328 QTC Calculation: 437 R Axis:   72 Text Interpretation:  Sinus  tachycardia Confirmed by Meilin Brosh ([id number]) on 09/18/2018 12:10:54 AM   Radiology Dg Neck Soft Tissue  Result Date: 09/18/2018 CLINICAL DATA:  23 year old male with hematemesis after IV heroin use. Sore throat and shortness of breath. EXAM: NECK SOFT TISSUES - 1+ VIEW COMPARISON:  None. FINDINGS: Normal prevertebral soft tissue contour. Other neck soft tissue contours are within normal limits. No osseous abnormality identified. Visualized tracheal air column is within normal limits. Abnormal lung apices with reticulonodular density which is greater on the left. No apical pneumothorax. IMPRESSION: 1. Abnormal lung apices with reticulonodular density greater on the left. 2. Negative radiographic appearance of the neck. Electronically Signed   By: Odessa FlemingH  Hall M.D.   On: 09/18/2018 00:09   Dg Chest 2 View  Result Date: 09/18/2018 CLINICAL DATA:  23 year old male with hematemesis after IV heroin use. Sore throat and shortness of breath. EXAM: CHEST - 2 VIEW COMPARISON:  Neck radiographs today reported separately. FINDINGS: Normal lung volumes. Widespread abnormal left lung opacity with a somewhat mixed interstitial and airspace quality. Right upper lung predominant lesser reticulonodular opacity. No superimposed pneumothorax, pleural effusion. Normal cardiac size and mediastinal contours. Visualized tracheal air column is within normal limits. No acute osseous abnormality identified. Negative visible bowel gas pattern. IMPRESSION: Asymmetric nonspecific pulmonary opacity, greater on the left, with no associated pneumothorax or pleural effusion. In this clinical setting top differential considerations include: Pulmonary talcosis, asymmetric pulmonary edema, or pulmonary hemorrhage. Electronically Signed   By: Odessa FlemingH  Hall M.D.   On: 09/18/2018 00:13    Procedures Procedures (including critical care time)  Medications Ordered in ED Medications  sodium chloride flush (NS) 0.9 % injection 3 mL (3 mLs Intravenous Not  Given 09/18/18 0026)  iopamidol (ISOVUE-370) 76 % injection (has no administration in time range)  potassium chloride 20 MEQ/15ML (10%) solution 40 mEq (has no administration in time range)  0.9 %  sodium chloride infusion (has no administration in time range)  iopamidol (ISOVUE-370) 76 % injection 75 mL (75 mLs Intravenous Contrast Given 09/18/18 0047)  pantoprazole (PROTONIX) injection 40 mg (40 mg Intravenous Given 09/18/18 0303)  ondansetron (ZOFRAN) injection 4 mg (4 mg Intravenous Given 09/18/18 0303)    Seen by critical care, recommend admit to medicine  Final Clinical Impressions(s) / ED Diagnoses   Admit to triad     Adon Gehlhausen, MD 09/18/18 [id number]

## 2018-09-19 ENCOUNTER — Inpatient Hospital Stay (HOSPITAL_COMMUNITY): Payer: Self-pay

## 2018-09-19 DIAGNOSIS — R740 Nonspecific elevation of levels of transaminase and lactic acid dehydrogenase [LDH]: Secondary | ICD-10-CM

## 2018-09-19 DIAGNOSIS — R0489 Hemorrhage from other sites in respiratory passages: Secondary | ICD-10-CM

## 2018-09-19 LAB — CBC
HCT: 37.3 % — ABNORMAL LOW (ref 39.0–52.0)
Hemoglobin: 12.5 g/dL — ABNORMAL LOW (ref 13.0–17.0)
MCH: 30.3 pg (ref 26.0–34.0)
MCHC: 33.5 g/dL (ref 30.0–36.0)
MCV: 90.3 fL (ref 80.0–100.0)
PLATELETS: 205 10*3/uL (ref 150–400)
RBC: 4.13 MIL/uL — ABNORMAL LOW (ref 4.22–5.81)
RDW: 12.5 % (ref 11.5–15.5)
WBC: 10.2 10*3/uL (ref 4.0–10.5)
nRBC: 0 % (ref 0.0–0.2)

## 2018-09-19 LAB — BASIC METABOLIC PANEL
Anion gap: 5 (ref 5–15)
BUN: 9 mg/dL (ref 6–20)
CO2: 23 mmol/L (ref 22–32)
Calcium: 8.1 mg/dL — ABNORMAL LOW (ref 8.9–10.3)
Chloride: 112 mmol/L — ABNORMAL HIGH (ref 98–111)
Creatinine, Ser: 0.74 mg/dL (ref 0.61–1.24)
GFR calc Af Amer: >60 mL/min (ref >60)
GFR calc non Af Amer: >60 mL/min (ref >60)
Glucose, Bld: 98 mg/dL (ref 70–99)
Potassium: 3.8 mmol/L (ref 3.5–5.1)
Sodium: 140 mmol/L (ref 135–145)

## 2018-09-19 LAB — HEPATITIS B SURFACE ANTIBODY,QUALITATIVE: Hep B S Ab: NONREACTIVE

## 2018-09-19 LAB — HEPATITIS C ANTIBODY

## 2018-09-19 LAB — HEPATITIS B CORE ANTIBODY, IGM: Hep B C IgM: NEGATIVE

## 2018-09-19 NOTE — Consult Note (Signed)
PCCM CONSULT NOTE    NAME:  Joshua Tucker, MRN:  [id number], DOB:  Sep 13, 1995, LOS: 1 ADMISSION DATE:  09/17/2018, CONSULTATION DATE:  09/18/2018 REFERRING MD:  Nicanor Alcon MD EDP, CHIEF COMPLAINT:  Coughing up Blood   Brief History   23 yr old homeless male who admits to polysubstance abuse presents from EMS after using IV heroin and noted to be coughing up blood. PCCM consulted for possible admission  History of present illness   23 yr old M who is homeless who reports only medical history of nephrolithiasis and presents to [facility] via EMS. He states that he was behind the [place] when he used IV heroin on 09/17/2018 after 8 pm.  He states that he passed out and came through when his 70 lb [employer] mix jumped on the right side of his chest. After which he felt chest pain and began to cough up some blood.  His friend then suggested he go to the ER. Prior to this he admits to using marijuana (smoking), snorting Xanax and Roxicodone (which he gets from someone who has a prescription) he denies alcohol use, cocaine or methamphetamines. Other symptoms include nausea and vomiting, and painful urination.  He denies fevers, nose bleeds, cough prior to this evening,    Significant Hospital Events   Hemoptysis small volume <50cc  Consults:  PCCM- 09/18/2018  Procedures:  None to date  Significant Diagnostic Tests:  UDS negative K+ 3.2 Anion Gap 15 Hgb 17.9 Hct 52.2 WBC 11,4 Plts 274    Micro Data:  No cultures No RVP or flu panel  Antimicrobials:  Not on Antibiotics    Objective   Blood pressure 121/81, pulse 77, temperature 98.4 F (36.9 C), resp. rate 17, height 5\' 11"  (1.803 m), weight 77.9 kg, SpO2 98 %.        Intake/Output Summary (Last 24 hours) at 09/19/2018 1216 Last data filed at 09/19/2018 0700 Gross per 24 hour  Intake 2160.02 ml  Output -  Net 2160.02 ml   Filed Weights   09/18/18 1546  Weight: 77.9 kg   SUBJ -denies dyspnea or chest pain. Afebrile Complains of trace hemoptysis  improved compared to yesterday  Examination: Does not appear ill, no acute distress, disheveled No pallor, icterus No cervical lymphadenopathy S1-S2 normal Clear breath sounds bilateral Soft nontender abdomen Alert and interactive   Assessment & Plan:  1. Hemoptysis small volume post polysubstance abuse and possible trauma from dog jumping on chest.- no evidence of rib fractures or contusion on recent imaging. Pt does have bilateral GGO infiltrates suggestive of Interstitial lung disease DDx:  1. Pneumonia- unlikely as pt has had no h/o fevers, chills, and no previous productive cough  HIV neg 1. Alveolitis - most likely as pt admits to history of inhalation of Roxicodone. Denies inhalation of cocaine/crack. Interestingly his UDS is negative..   2. Pulmonary hemorrhage: diffuse hemorrhage less likely, small volume of hemoptysis No evidence of hematuria on UA  Plan: No need for bronchoscopy at this point.  He can be observed for another 24 hours and then discharge, ideally he needs follow-up chest x-ray in 2 to 4 weeks and follow-up of these infiltrates and symptoms to resolution. Not sure how inclined he will be to follow-up   PCCM available as needed     Review of Systems:   Marland KitchenMarland KitchenReview of Systems  Constitutional: Negative for chills and fever.  HENT: Negative for congestion, nosebleeds, sinus pain and sore throat.   Eyes: Negative.   Respiratory: Positive for  hemoptysis. Negative for shortness of breath.   Cardiovascular: Negative.  Negative for leg swelling.  Gastrointestinal: Negative.  Negative for abdominal pain.  Genitourinary: Negative.  Negative for flank pain and urgency.  Musculoskeletal: Negative.   Skin: Negative.   Neurological: Negative.   Endo/Heme/Allergies: Negative.     Cyril Mourning MD. Tonny Bollman. [facility] Pager [phone] If no response call [phone]   09/19/2018

## 2018-09-19 NOTE — Progress Notes (Signed)
CSW received consult regarding substance use and homeless resources. CSW spoke with patient. Patient reported that he has a place to stay at discharge and he did not need any resources. He reported that he is eager to get back to his dog. He stated that his dog is currently with his friend. CSW also offered substance use resources but he declined them as well and denied any other needs.   CSW signing off.   Osborne Casco Haunani Dickard LCSW [phone]

## 2018-09-19 NOTE — Progress Notes (Signed)
PROGRESS NOTE        PATIENT DETAILS Name: Joshua MelterBreige Blecha Age: 23 y.o. Sex: male Date of Birth: 04/11/1996 Admit Date: 09/17/2018 Admitting Physician Courage Mariea ClontsEmokpae, MD ZOX:[id number]:Patient, No Pcp Per  Brief Narrative: Patient is a 23 y.o. homeless male with history of polysubstance use-presented to the ED for evaluation of hemoptysis.  Subjective: No hemoptysis this morning-he claims he had some minimal hemoptysis (only traces of blood) last night.  No shortness of breath on exertion.  Assessment/Plan: Small volume hemoptysis: Etiology unclear-but could be secondary to trauma from patient's dog jumping on the chest-patient also has diffuse bilateral groundglass opacities but not felt to have pulmonary hemorrhage due to stability in hemoglobin.  Groundglass opacities likely secondary to underlying interstitial lung disease from polysubstance abuse.  Will await further recommendations from pulmonology.  Diffuse groundglass opacities: Suspicion for interstitial lung disease-likely secondary to polysubstance use-apparently patient inhales crushed narcotics.  Not felt to have pneumonia-clinical features are not consistent with infectious etiology at this point.  Continue supportive care with bronchodilators, supplemental oxygen and await further recommendations from pulmonology.  Minimally elevated transaminases: Hepatitis B serology negative, however hepatitis C antibody is positive-we will obtain a viral load.  HIV negative.  Supportive care in the meantime.  Polysubstance abuse: Inhales Roxicodone and Xanax-also admits to IV heroin use.  Urine drug screen is negative.  Have counseled-not sure if patient has any intention of quitting at this point  Homelessness: Social work evaluation  DVT Prophylaxis: SCD's  Code Status: Full code   Family Communication: None at bedside  Disposition Plan: Remain inpatient  Antimicrobial agents: Anti-infectives (From admission,  onward)   None      Procedures: None  CONSULTS:  pulmonary/intensive care  Time spent: 25- minutes-Greater than 50% of this time was spent in counseling, explanation of diagnosis, planning of further management, and coordination of care.  MEDICATIONS: Scheduled Meds: . gabapentin  300 mg Oral TID  . guaiFENesin  600 mg Oral BID  . hydrOXYzine  25 mg Oral TID  . methocarbamol  750 mg Oral QID  . sodium chloride flush  3 mL Intravenous Once  . sodium chloride flush  3 mL Intravenous Q12H   Continuous Infusions: . sodium chloride    . sodium chloride 50 mL/hr at 09/19/18 1010   PRN Meds:.sodium chloride, acetaminophen **OR** acetaminophen, albuterol, dextromethorphan-guaiFENesin, ondansetron **OR** ondansetron (ZOFRAN) IV, phenol, polyethylene glycol, sodium chloride flush, traZODone   PHYSICAL EXAM: Vital signs: Vitals:   09/18/18 1946 09/18/18 2141 09/19/18 0612 09/19/18 0801  BP:  118/78 121/81   Pulse:  94 77   Resp:  20 17   Temp:  98.4 F (36.9 C) 98.4 F (36.9 C)   TempSrc:  Oral    SpO2: 97% 98% 98% 98%  Weight:      Height:       Filed Weights   09/18/18 1546  Weight: 77.9 kg   Body mass index is 23.95 kg/m.   General appearance :Awake, alert, not in any distress. Speech Clear. Not toxic Looking HEENT: Atraumatic and Normocephalic Neck: supple, no JVD. No cervical lymphadenopathy. No thyromegaly Resp:Good air entry bilaterally, no added sounds  CVS: S1 S2 regular, no murmurs.  GI: Bowel sounds present, Non tender and not distended with no gaurding, rigidity or rebound.No organomegaly Extremities: B/L Lower Ext shows no edema, both legs are warm to  touch Neurology:  speech clear,Non focal, sensation is grossly intact. Psychiatric: Normal judgment and insight. Alert and oriented x 3. Normal mood. Musculoskeletal:No digital cyanosis Skin:No Rash, warm and dry Wounds:N/A  I have personally reviewed following labs and imaging studies  LABORATORY  DATA: CBC: Recent Labs  Lab 09/17/18 2347 09/18/18 0315 09/18/18 1455 09/18/18 1543 09/18/18 2134 09/19/18 0429  WBC 11.4* 15.5* 14.8* 14.7* 10.9* 10.2  NEUTROABS 9.8*  --   --   --   --   --   HGB 17.9* 15.1 13.1 13.5 12.8* 12.5*  HCT 52.2* 44.0 39.1 39.8 38.1* 37.3*  MCV 91.3 88.2 89.7 89.6 90.1 90.3  PLT 274 235 218 221 197 205    Basic Metabolic Panel: Recent Labs  Lab 09/17/18 2347 09/19/18 0429  NA 139 140  K 3.2* 3.8  CL 101 112*  CO2 23 23  GLUCOSE 112* 98  BUN 10 9  CREATININE 0.73 0.74  CALCIUM 9.1 8.1*    GFR: Estimated Creatinine Clearance: 154.3 mL/min (by C-G formula based on SCr of 0.74 mg/dL).  Liver Function Tests: Recent Labs  Lab 09/17/18 2347  AST 65*  ALT 85*  ALKPHOS 67  BILITOT 1.1  PROT 8.0  ALBUMIN 3.9   Recent Labs  Lab 09/17/18 2347  LIPASE 20   No results for input(s): AMMONIA in the last 168 hours.  Coagulation Profile: Recent Labs  Lab 09/18/18 0031  INR 1.00    Cardiac Enzymes: No results for input(s): CKTOTAL, CKMB, CKMBINDEX, TROPONINI in the last 168 hours.  BNP (last 3 results) No results for input(s): PROBNP in the last 8760 hours.  HbA1C: No results for input(s): HGBA1C in the last 72 hours.  CBG: No results for input(s): GLUCAP in the last 168 hours.  Lipid Profile: No results for input(s): CHOL, HDL, LDLCALC, TRIG, CHOLHDL, LDLDIRECT in the last 72 hours.  Thyroid Function Tests: No results for input(s): TSH, T4TOTAL, FREET4, T3FREE, THYROIDAB in the last 72 hours.  Anemia Panel: No results for input(s): VITAMINB12, FOLATE, FERRITIN, TIBC, IRON, RETICCTPCT in the last 72 hours.  Urine analysis:    Component Value Date/Time   COLORURINE YELLOW 09/18/2018 0522   APPEARANCEUR CLEAR 09/18/2018 0522   LABSPEC 1.040 (H) 09/18/2018 0522   PHURINE 7.0 09/18/2018 0522   GLUCOSEU NEGATIVE 09/18/2018 0522   HGBUR NEGATIVE 09/18/2018 0522   BILIRUBINUR NEGATIVE 09/18/2018 0522   KETONESUR 20 (A)  09/18/2018 0522   PROTEINUR NEGATIVE 09/18/2018 0522   NITRITE NEGATIVE 09/18/2018 0522   LEUKOCYTESUR TRACE (A) 09/18/2018 0522    Sepsis Labs: Lactic Acid, Venous No results found for: LATICACIDVEN  MICROBIOLOGY: No results found for this or any previous visit (from the past 240 hour(s)).  RADIOLOGY STUDIES/RESULTS: Dg Neck Soft Tissue  Result Date: 09/18/2018 CLINICAL DATA:  23 year old male with hematemesis after IV heroin use. Sore throat and shortness of breath. EXAM: NECK SOFT TISSUES - 1+ VIEW COMPARISON:  None. FINDINGS: Normal prevertebral soft tissue contour. Other neck soft tissue contours are within normal limits. No osseous abnormality identified. Visualized tracheal air column is within normal limits. Abnormal lung apices with reticulonodular density which is greater on the left. No apical pneumothorax. IMPRESSION: 1. Abnormal lung apices with reticulonodular density greater on the left. 2. Negative radiographic appearance of the neck. Electronically Signed   By: Odessa Fleming M.D.   On: 09/18/2018 00:09   Dg Chest 2 View  Result Date: 09/18/2018 CLINICAL DATA:  23 year old male with hematemesis after IV heroin use.  Sore throat and shortness of breath. EXAM: CHEST - 2 VIEW COMPARISON:  Neck radiographs today reported separately. FINDINGS: Normal lung volumes. Widespread abnormal left lung opacity with a somewhat mixed interstitial and airspace quality. Right upper lung predominant lesser reticulonodular opacity. No superimposed pneumothorax, pleural effusion. Normal cardiac size and mediastinal contours. Visualized tracheal air column is within normal limits. No acute osseous abnormality identified. Negative visible bowel gas pattern. IMPRESSION: Asymmetric nonspecific pulmonary opacity, greater on the left, with no associated pneumothorax or pleural effusion. In this clinical setting top differential considerations include: Pulmonary talcosis, asymmetric pulmonary edema, or pulmonary  hemorrhage. Electronically Signed   By: Odessa Fleming M.D.   On: 09/18/2018 00:13   Ct Angio Chest Pe W And/or Wo Contrast  Result Date: 09/18/2018 CLINICAL DATA:  Hemoptysis after IV heroin use EXAM: CT ANGIOGRAPHY CHEST WITH CONTRAST TECHNIQUE: Multidetector CT imaging of the chest was performed using the standard protocol during bolus administration of intravenous contrast. Multiplanar CT image reconstructions and MIPs were obtained to evaluate the vascular anatomy. CONTRAST:  26mL ISOVUE-370 IOPAMIDOL (ISOVUE-370) INJECTION 76% COMPARISON:  None. FINDINGS: Cardiovascular: Satisfactory opacification of the pulmonary arteries to the segmental level. No evidence of pulmonary embolism. Normal heart size. No pericardial effusion. Mediastinum/Nodes: Choose Lungs/Pleura: Nodular ground-glass opacities throughout bilateral lungs bilaterally which may reflect alveolitis versus pulmonary hemorrhage versus less likely pulmonary edema. No pleural effusion or pneumothorax. Upper Abdomen: No acute abnormality. Musculoskeletal: No chest wall abnormality. No acute or significant osseous findings. Review of the MIP images confirms the above findings. IMPRESSION: 1. Nodular ground-glass opacities throughout bilateral lungs bilaterally which may reflect alveolitis versus pulmonary hemorrhage versus less likely pulmonary edema. Electronically Signed   By: Elige Ko   On: 09/18/2018 01:16   Dg Chest Port 1v Same Day  Result Date: 09/19/2018 CLINICAL DATA:  Cough and shortness of breath with hemoptysis following heroin use EXAM: PORTABLE CHEST 1 VIEW COMPARISON:  09/18/2018, 09/17/2018 FINDINGS: Cardiac shadow is stable. Diffuse alveolar opacities are again identified left greater than right similar to that seen on the previous exams. No new focal infiltrate, effusion or pneumothorax is noted. No bony abnormality is seen. IMPRESSION: Stable alveolar opacities bilaterally left greater than right. Electronically Signed   By: Alcide Clever M.D.   On: 09/19/2018 08:51     LOS: 1 day   Jeoffrey Massed, MD  [facility]  If 7PM-7AM, please contact night-coverage  Please page via [email or web address]-click on MD name and type text message  09/19/2018, 10:46 AM

## 2018-09-20 MED ORDER — DM-GUAIFENESIN ER 30-600 MG PO TB12: 1.0000 | ORAL_TABLET | Freq: Two times a day (BID) | ORAL | 0 refills | Status: AC | PRN

## 2018-09-20 MED ORDER — ACETAMINOPHEN 325 MG PO TABS: 650.0000 mg | ORAL_TABLET | Freq: Four times a day (QID) | ORAL | Status: AC | PRN

## 2018-09-20 NOTE — Plan of Care (Signed)
Pt received a bus pass upon discharge.

## 2018-09-20 NOTE — Discharge Summary (Signed)
PATIENT DETAILS Name: Joshua Tucker Age: 23 y.o. Sex: male Date of Birth: Jun 09, 1996 MRN: [id number]. Admitting Physician: Shon Hale, MD OAC:[id number], No Pcp Per  Admit Date: 09/17/2018 Discharge date: 09/20/2018  Recommendations for Outpatient Follow-up:  1. Follow up with PCP in 1-2 weeks 2. Please obtain BMP/CBC in one week 3. Please ensure follow-up with pulmonology 4. Patient requires a 2 view chest x-ray and 2 weeks 5. Continue attempts at counseling regarding avoidance of illicit drug use 6. HCV viral load pending-please follow-up   Admitted From:  Home  Disposition: Home   Home Health: No  Equipment/Devices: None  Discharge Condition: Stable  CODE STATUS: FULL CODE  Diet recommendation:  Regular  Brief Summary: See H&P, Labs, Consult and Test reports for all details in brief, Patient is a 23 y.o. homeless male with history of polysubstance use-presented to the ED for evaluation of hemoptysis.  Brief Hospital Course: Small volume hemoptysis: Etiology unclear-but could be secondary to trauma from patient's dog jumping on the chest-patient also has diffuse bilateral groundglass opacities but not felt to have pulmonary hemorrhage due to stability in hemoglobin.  Groundglass opacities likely secondary to underlying interstitial lung disease from polysubstance abuse.    Recommendations from pulmonology are to follow-up in the outpatient setting and repeat a two-view chest x-ray.  Patient asked to avoid snorting and abusing illicit drugs.  He was counseled extensively-he is aware of the life-threatening and life disabling consequences of  interstitial lung disease.  Diffuse groundglass opacities: Suspicion for interstitial lung disease-likely secondary to polysubstance use-apparently patient inhales crushed narcotics.  Not felt to have pneumonia-clinical features are not consistent with infectious etiology at this point.    Managed with supportive care-seen by  pulmonology-with recommendations to repeat 2 view chest x-ray in the outpatient setting.  Patient encouraged to follow-up with pulmonology.  Minimally elevated transaminases: Hepatitis B serology negative, however hepatitis C antibody is positive-have ordered a viral load-we will await results.  HIV negative.   Polysubstance abuse: Inhales Roxicodone and Xanax-also admits to IV heroin use.  Urine drug screen is negative.  Have counseled-not sure if patient has any intention of quitting at this point  Homelessness: Social work evaluation complete-per social work-patient has a place to stay.  Procedures/Studies: None  Discharge Diagnoses:  Principal Problem:   Hemoptysis Active Problems:   Hypokalemia   IVDU (intravenous drug user)   Polysubstance abuse [facility])   Discharge Instructions:  Activity:  As tolerated   Discharge Instructions    Diet general   Complete by:  As directed    Discharge instructions   Complete by:  As directed    Follow with Primary MD  In 1 week  Follow with the Pulmonologist (Lung Doctor) in 2 weeks, you need a repeat chest xray to see if the lung scarring/infiltrates have resolved  Stop abusing drugs-refrain from snorting and injecting drugs.These may be causing your lung infiltrates  Please get a complete blood count and chemistry panel checked by your Primary MD at your next visit, and again as instructed by your Primary MD.  Get Medicines reviewed and adjusted: Please take all your medications with you for your next visit with your Primary MD  Laboratory/radiological data: Please request your Primary MD to go over all hospital tests and procedure/radiological results at the follow up, please ask your Primary MD to get all Hospital records sent to his/her office.  In some cases, they will be blood work, cultures and biopsy results pending at the time of  your discharge. Please request that your primary care M.D. follows up on these  results.  Also Note the following: If you experience worsening of your admission symptoms, develop shortness of breath, life threatening emergency, suicidal or homicidal thoughts you must seek medical attention immediately by calling 911 or calling your MD immediately  if symptoms less severe.  You must read complete instructions/literature along with all the possible adverse reactions/side effects for all the Medicines you take and that have been prescribed to you. Take any new Medicines after you have completely understood and accpet all the possible adverse reactions/side effects.   Do not drive when taking Pain medications or sleeping medications (Benzodaizepines)  Do not take more than prescribed Pain, Sleep and Anxiety Medications. It is not advisable to combine anxiety,sleep and pain medications without talking with your primary care practitioner  Special Instructions: If you have smoked or chewed Tobacco  in the last 2 yrs please stop smoking, stop any regular Alcohol  and or any Recreational drug use.  Wear Seat belts while driving.  Please note: You were cared for by a hospitalist during your hospital stay. Once you are discharged, your primary care physician will handle any further medical issues. Please note that NO REFILLS for any discharge medications will be authorized once you are discharged, as it is imperative that you return to your primary care physician (or establish a relationship with a primary care physician if you do not have one) for your post hospital discharge needs so that they can reassess your need for medications and monitor your lab values.   Increase activity slowly   Complete by:  As directed      Allergies as of 09/20/2018   No Known Allergies     Medication List    TAKE these medications   acetaminophen 325 MG tablet Commonly known as:  TYLENOL Take 2 tablets (650 mg total) by mouth every 6 (six) hours as needed for mild pain (or Fever >/= 101).    dextromethorphan-guaiFENesin 30-600 MG 12hr tablet Commonly known as:  MUCINEX DM Take 1 tablet by mouth 2 (two) times daily as needed for cough.      Follow-up Information    Oretha MilchAlva, Rakesh V, MD. Schedule an appointment as soon as possible for a visit in 2 week(s).   Specialty:  Pulmonary Disease Why:  for post hospital discharge visit, you also need a repeat chest xray Contact information: [address] [place] [address] [phone]          No Known Allergies  Consultations:   GI   Other Procedures/Studies: Dg Neck Soft Tissue  Result Date: 09/18/2018 CLINICAL DATA:  23 year old male with hematemesis after IV heroin use. Sore throat and shortness of breath. EXAM: NECK SOFT TISSUES - 1+ VIEW COMPARISON:  None. FINDINGS: Normal prevertebral soft tissue contour. Other neck soft tissue contours are within normal limits. No osseous abnormality identified. Visualized tracheal air column is within normal limits. Abnormal lung apices with reticulonodular density which is greater on the left. No apical pneumothorax. IMPRESSION: 1. Abnormal lung apices with reticulonodular density greater on the left. 2. Negative radiographic appearance of the neck. Electronically Signed   By: Odessa FlemingH  Hall M.D.   On: 09/18/2018 00:09   Dg Chest 2 View  Result Date: 09/18/2018 CLINICAL DATA:  23 year old male with hematemesis after IV heroin use. Sore throat and shortness of breath. EXAM: CHEST - 2 VIEW COMPARISON:  Neck radiographs today reported separately. FINDINGS: Normal lung  volumes. Widespread abnormal left lung opacity with a somewhat mixed interstitial and airspace quality. Right upper lung predominant lesser reticulonodular opacity. No superimposed pneumothorax, pleural effusion. Normal cardiac size and mediastinal contours. Visualized tracheal air column is within normal limits. No acute osseous abnormality identified. Negative visible bowel gas pattern. IMPRESSION: Asymmetric  nonspecific pulmonary opacity, greater on the left, with no associated pneumothorax or pleural effusion. In this clinical setting top differential considerations include: Pulmonary talcosis, asymmetric pulmonary edema, or pulmonary hemorrhage. Electronically Signed   By: Odessa Fleming M.D.   On: 09/18/2018 00:13   Ct Angio Chest Pe W And/or Wo Contrast  Result Date: 09/18/2018 CLINICAL DATA:  Hemoptysis after IV heroin use EXAM: CT ANGIOGRAPHY CHEST WITH CONTRAST TECHNIQUE: Multidetector CT imaging of the chest was performed using the standard protocol during bolus administration of intravenous contrast. Multiplanar CT image reconstructions and MIPs were obtained to evaluate the vascular anatomy. CONTRAST:  75mL ISOVUE-370 IOPAMIDOL (ISOVUE-370) INJECTION 76% COMPARISON:  None. FINDINGS: Cardiovascular: Satisfactory opacification of the pulmonary arteries to the segmental level. No evidence of pulmonary embolism. Normal heart size. No pericardial effusion. Mediastinum/Nodes: Choose Lungs/Pleura: Nodular ground-glass opacities throughout bilateral lungs bilaterally which may reflect alveolitis versus pulmonary hemorrhage versus less likely pulmonary edema. No pleural effusion or pneumothorax. Upper Abdomen: No acute abnormality. Musculoskeletal: No chest wall abnormality. No acute or significant osseous findings. Review of the MIP images confirms the above findings. IMPRESSION: 1. Nodular ground-glass opacities throughout bilateral lungs bilaterally which may reflect alveolitis versus pulmonary hemorrhage versus less likely pulmonary edema. Electronically Signed   By: Elige Ko   On: 09/18/2018 01:16   Dg Chest Port 1v Same Day  Result Date: 09/19/2018 CLINICAL DATA:  Cough and shortness of breath with hemoptysis following heroin use EXAM: PORTABLE CHEST 1 VIEW COMPARISON:  09/18/2018, 09/17/2018 FINDINGS: Cardiac shadow is stable. Diffuse alveolar opacities are again identified left greater than right similar to  that seen on the previous exams. No new focal infiltrate, effusion or pneumothorax is noted. No bony abnormality is seen. IMPRESSION: Stable alveolar opacities bilaterally left greater than right. Electronically Signed   By: Alcide Clever M.D.   On: 09/19/2018 08:51      TODAY-DAY OF DISCHARGE:  Subjective:   Joshua Tucker today has no headache,no chest abdominal pain,no new weakness tingling or numbness, feels much better wants to go home today.  Objective:   Blood pressure 126/78, pulse 99, temperature 99.1 F (37.3 C), temperature source Oral, resp. rate 20, height 5\' 11"  (1.803 m), weight 77.9 kg, SpO2 96 %. No intake or output data in the 24 hours ending 09/20/18 0828 Filed Weights   09/18/18 1546  Weight: 77.9 kg    Exam: Awake Alert, Oriented *3, No new F.N deficits, Normal affect [place].AT,PERRAL Supple Neck,No JVD, No cervical lymphadenopathy appriciated.  Symmetrical Chest wall movement, Good air movement bilaterally, CTAB RRR,No Gallops,Rubs or new Murmurs, No Parasternal Heave +ve B.Sounds, Abd Soft, Non tender, No organomegaly appriciated, No rebound -guarding or rigidity. No Cyanosis, Clubbing or edema, No new Rash or bruise   PERTINENT RADIOLOGIC STUDIES: Dg Neck Soft Tissue  Result Date: 09/18/2018 CLINICAL DATA:  23 year old male with hematemesis after IV heroin use. Sore throat and shortness of breath. EXAM: NECK SOFT TISSUES - 1+ VIEW COMPARISON:  None. FINDINGS: Normal prevertebral soft tissue contour. Other neck soft tissue contours are within normal limits. No osseous abnormality identified. Visualized tracheal air column is within normal limits. Abnormal lung apices with reticulonodular density which is greater on  the left. No apical pneumothorax. IMPRESSION: 1. Abnormal lung apices with reticulonodular density greater on the left. 2. Negative radiographic appearance of the neck. Electronically Signed   By: Odessa Fleming M.D.   On: 09/18/2018 00:09   Dg Chest 2  View  Result Date: 09/18/2018 CLINICAL DATA:  23 year old male with hematemesis after IV heroin use. Sore throat and shortness of breath. EXAM: CHEST - 2 VIEW COMPARISON:  Neck radiographs today reported separately. FINDINGS: Normal lung volumes. Widespread abnormal left lung opacity with a somewhat mixed interstitial and airspace quality. Right upper lung predominant lesser reticulonodular opacity. No superimposed pneumothorax, pleural effusion. Normal cardiac size and mediastinal contours. Visualized tracheal air column is within normal limits. No acute osseous abnormality identified. Negative visible bowel gas pattern. IMPRESSION: Asymmetric nonspecific pulmonary opacity, greater on the left, with no associated pneumothorax or pleural effusion. In this clinical setting top differential considerations include: Pulmonary talcosis, asymmetric pulmonary edema, or pulmonary hemorrhage. Electronically Signed   By: Odessa Fleming M.D.   On: 09/18/2018 00:13   Ct Angio Chest Pe W And/or Wo Contrast  Result Date: 09/18/2018 CLINICAL DATA:  Hemoptysis after IV heroin use EXAM: CT ANGIOGRAPHY CHEST WITH CONTRAST TECHNIQUE: Multidetector CT imaging of the chest was performed using the standard protocol during bolus administration of intravenous contrast. Multiplanar CT image reconstructions and MIPs were obtained to evaluate the vascular anatomy. CONTRAST:  75mL ISOVUE-370 IOPAMIDOL (ISOVUE-370) INJECTION 76% COMPARISON:  None. FINDINGS: Cardiovascular: Satisfactory opacification of the pulmonary arteries to the segmental level. No evidence of pulmonary embolism. Normal heart size. No pericardial effusion. Mediastinum/Nodes: Choose Lungs/Pleura: Nodular ground-glass opacities throughout bilateral lungs bilaterally which may reflect alveolitis versus pulmonary hemorrhage versus less likely pulmonary edema. No pleural effusion or pneumothorax. Upper Abdomen: No acute abnormality. Musculoskeletal: No chest wall abnormality. No  acute or significant osseous findings. Review of the MIP images confirms the above findings. IMPRESSION: 1. Nodular ground-glass opacities throughout bilateral lungs bilaterally which may reflect alveolitis versus pulmonary hemorrhage versus less likely pulmonary edema. Electronically Signed   By: Elige Ko   On: 09/18/2018 01:16   Dg Chest Port 1v Same Day  Result Date: 09/19/2018 CLINICAL DATA:  Cough and shortness of breath with hemoptysis following heroin use EXAM: PORTABLE CHEST 1 VIEW COMPARISON:  09/18/2018, 09/17/2018 FINDINGS: Cardiac shadow is stable. Diffuse alveolar opacities are again identified left greater than right similar to that seen on the previous exams. No new focal infiltrate, effusion or pneumothorax is noted. No bony abnormality is seen. IMPRESSION: Stable alveolar opacities bilaterally left greater than right. Electronically Signed   By: Alcide Clever M.D.   On: 09/19/2018 08:51     PERTINENT LAB RESULTS: CBC: Recent Labs    09/18/18 2134 09/19/18 0429  WBC 10.9* 10.2  HGB 12.8* 12.5*  HCT 38.1* 37.3*  PLT 197 205   CMET CMP     Component Value Date/Time   NA 140 09/19/2018 0429   K 3.8 09/19/2018 0429   CL 112 (H) 09/19/2018 0429   CO2 23 09/19/2018 0429   GLUCOSE 98 09/19/2018 0429   BUN 9 09/19/2018 0429   CREATININE 0.74 09/19/2018 0429   CALCIUM 8.1 (L) 09/19/2018 0429   PROT 8.0 09/17/2018 2347   ALBUMIN 3.9 09/17/2018 2347   AST 65 (H) 09/17/2018 2347   ALT 85 (H) 09/17/2018 2347   ALKPHOS 67 09/17/2018 2347   BILITOT 1.1 09/17/2018 2347   GFRNONAA >60 09/19/2018 0429   GFRAA >60 09/19/2018 0429    GFR  Estimated Creatinine Clearance: 154.3 mL/min (by C-G formula based on SCr of 0.74 mg/dL). Recent Labs    09/17/18 2347  LIPASE 20   No results for input(s): CKTOTAL, CKMB, CKMBINDEX, TROPONINI in the last 72 hours. Invalid input(s): POCBNP No results for input(s): DDIMER in the last 72 hours. No results for input(s): HGBA1C in the  last 72 hours. No results for input(s): CHOL, HDL, LDLCALC, TRIG, CHOLHDL, LDLDIRECT in the last 72 hours. No results for input(s): TSH, T4TOTAL, T3FREE, THYROIDAB in the last 72 hours.  Invalid input(s): FREET3 No results for input(s): VITAMINB12, FOLATE, FERRITIN, TIBC, IRON, RETICCTPCT in the last 72 hours. Coags: Recent Labs    09/18/18 0031  INR 1.00   Microbiology: No results found for this or any previous visit (from the past 240 hour(s)).  FURTHER DISCHARGE INSTRUCTIONS:  Get Medicines reviewed and adjusted: Please take all your medications with you for your next visit with your Primary MD  Laboratory/radiological data: Please request your Primary MD to go over all hospital tests and procedure/radiological results at the follow up, please ask your Primary MD to get all Hospital records sent to his/her office.  In some cases, they will be blood work, cultures and biopsy results pending at the time of your discharge. Please request that your primary care M.D. goes through all the records of your hospital data and follows up on these results.  Also Note the following: If you experience worsening of your admission symptoms, develop shortness of breath, life threatening emergency, suicidal or homicidal thoughts you must seek medical attention immediately by calling 911 or calling your MD immediately  if symptoms less severe.  You must read complete instructions/literature along with all the possible adverse reactions/side effects for all the Medicines you take and that have been prescribed to you. Take any new Medicines after you have completely understood and accpet all the possible adverse reactions/side effects.   Do not drive when taking Pain medications or sleeping medications (Benzodaizepines)  Do not take more than prescribed Pain, Sleep and Anxiety Medications. It is not advisable to combine anxiety,sleep and pain medications without talking with your primary care  practitioner  Special Instructions: If you have smoked or chewed Tobacco  in the last 2 yrs please stop smoking, stop any regular Alcohol  and or any Recreational drug use.  Wear Seat belts while driving.  Please note: You were cared for by a hospitalist during your hospital stay. Once you are discharged, your primary care physician will handle any further medical issues. Please note that NO REFILLS for any discharge medications will be authorized once you are discharged, as it is imperative that you return to your primary care physician (or establish a relationship with a primary care physician if you do not have one) for your post hospital discharge needs so that they can reassess your need for medications and monitor your lab values.  Total Time spent coordinating discharge including counseling, education and face to face time equals 25  minutes.  SignedJeoffrey Massed: Mihailo Sage 09/20/2018 8:28 AM

## 2018-09-20 NOTE — Care Management Note (Signed)
Case Management Note  Patient Details  Name: Joshua Tucker MRN: [id number] Date of Birth: Dec 20, 1995  Subjective/Objective:   Patient homeless, CSW spoke with patient about shelter resources.  NCM has scheduled a follow up apt at the [facility] clinic for 2/20 at 4 pm for patient, he can go there to get medication discounts as well but he is being dc with otc medications.                  Action/Plan: DC when ready.   Expected Discharge Date:  09/20/18               Expected Discharge Plan:  Homeless Shelter  In-House Referral:  Clinical Social Work  Discharge planning Services  CM Consult, [facility], Follow-up appt scheduled  Post Acute Care Choice:    Choice offered to:     DME Arranged:    DME Agency:     HH Arranged:    HH Agency:     Status of Service:  Completed, signed off  If discussed at [employer], dates discussed:    Additional Comments:  Leone Haven, RN 09/20/2018, 9:07 AM

## 2018-09-22 LAB — HCV RNA QUANT RFLX ULTRA OR GENOTYP
HCV RNA Qnt(log copy/mL): 6.948 log10 IU/mL
HepC Qn: 8870000 IU/mL

## 2018-09-22 LAB — HEPATITIS C GENOTYPE

## 2018-10-03 ENCOUNTER — Inpatient Hospital Stay: Payer: Self-pay | Admitting: Critical Care Medicine

## 2020-06-13 IMAGING — CT CT ANGIO CHEST
2 of 6 series · 19 of 36 positions shown · IV contrast (iopamidol)
Comparison: None.

CLINICAL DATA: Hemoptysis after IV heroin use

EXAM:
CT ANGIOGRAPHY CHEST WITH CONTRAST
TECHNIQUE: Multidetector CT imaging of the chest was performed using the
standard protocol during bolus administration of intravenous
contrast. Multiplanar CT image reconstructions and MIPs were
obtained to evaluate the vascular anatomy.
CONTRAST:  75mL PP2BXT-840 IOPAMIDOL (PP2BXT-840) INJECTION 76%

[Series 7: pe thins · axial · 0.82mm/px · z∈[+1081,+1371]mm · 18 of 462 slices shown]
[im 24/462  lung]
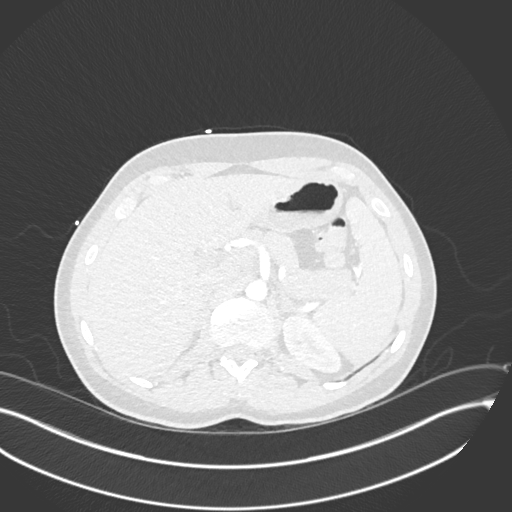
[im 47/462  mediastinal]
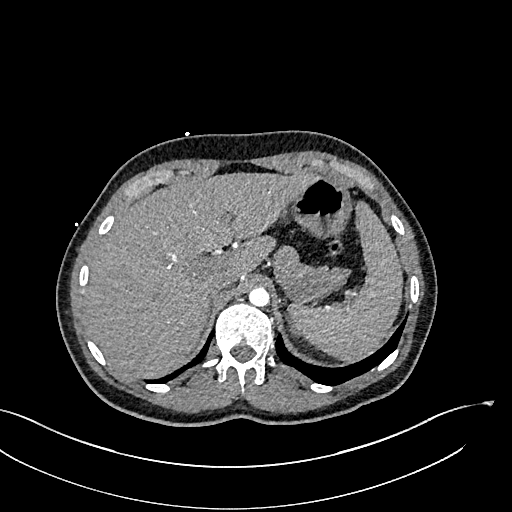
[im 70/462  lung]
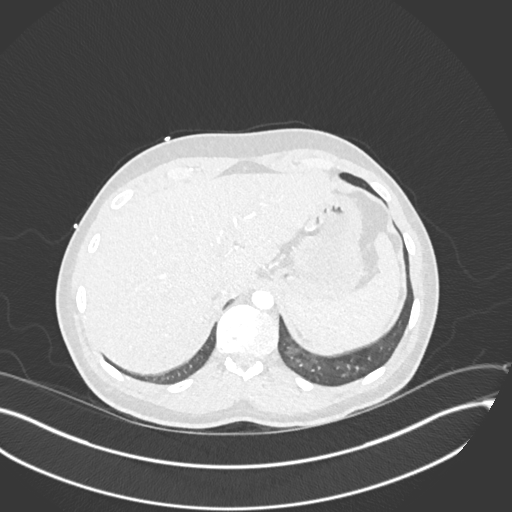
[im 93/462  mediastinal]
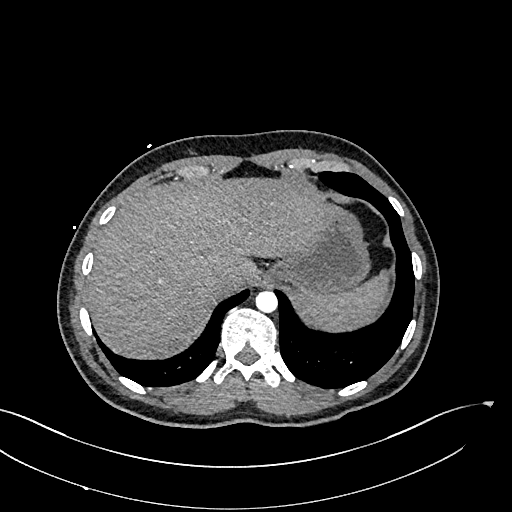
[im 116/462  lung]
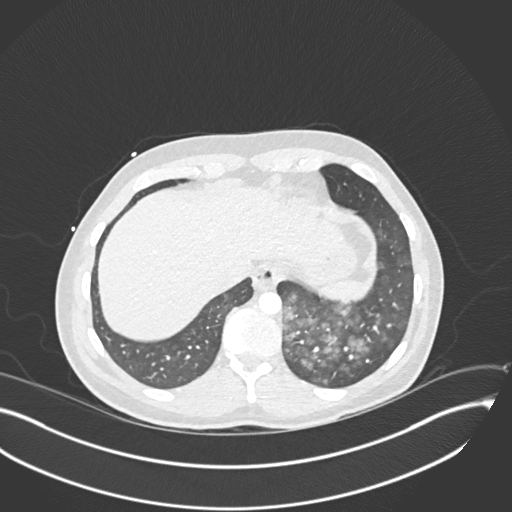
[im 139/462  mediastinal]
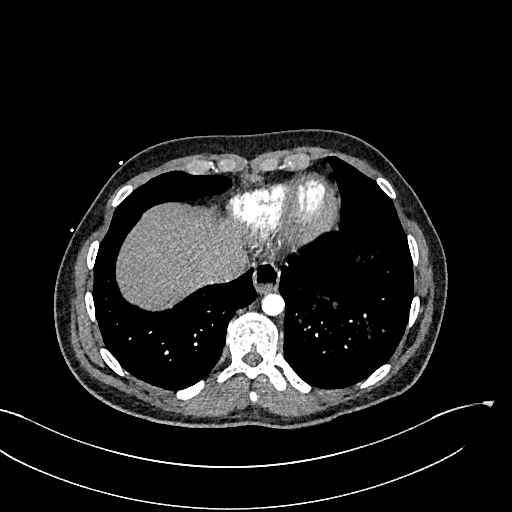
[im 162/462  lung]
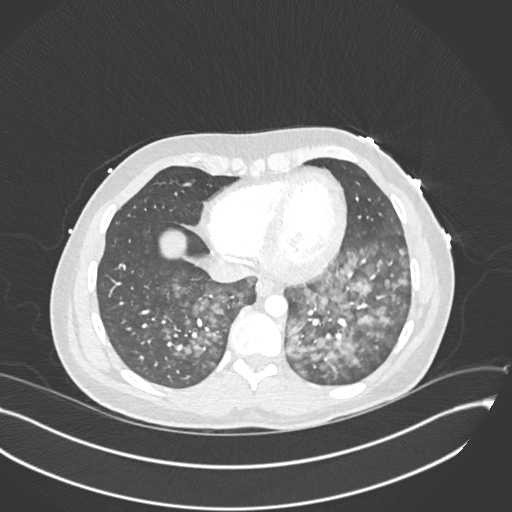
[im 185/462  mediastinal]
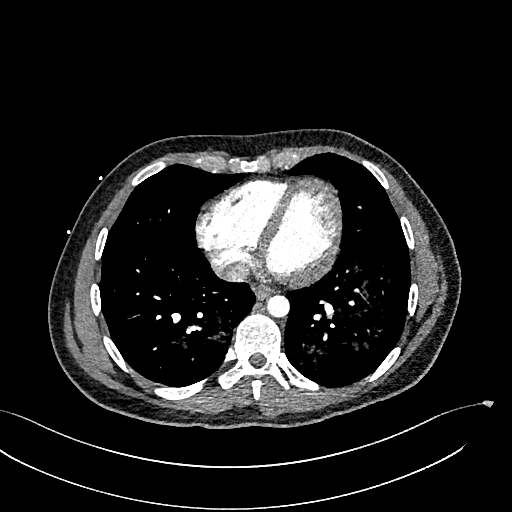
[im 208/462  lung]
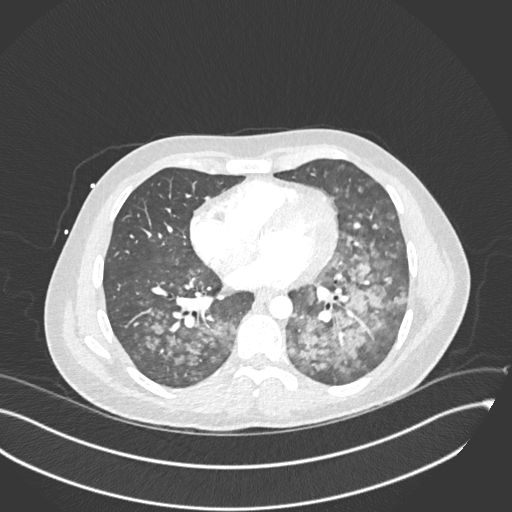
[im 254/462  mediastinal]
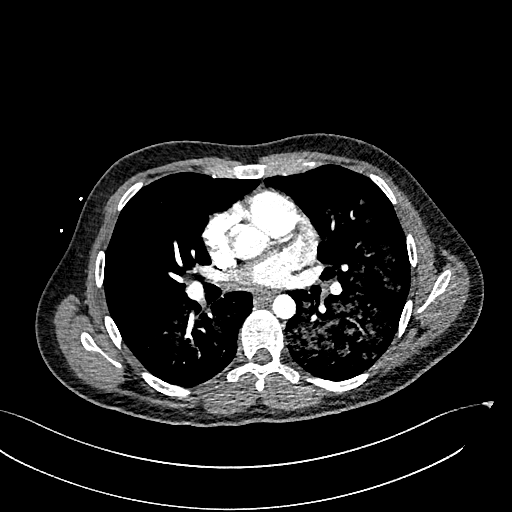
[im 277/462  lung]
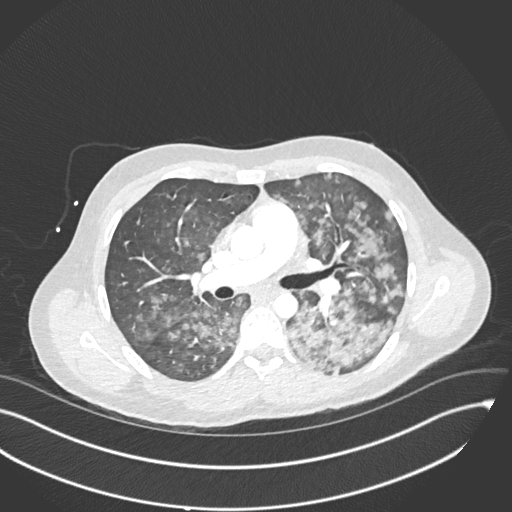
[im 300/462  mediastinal]
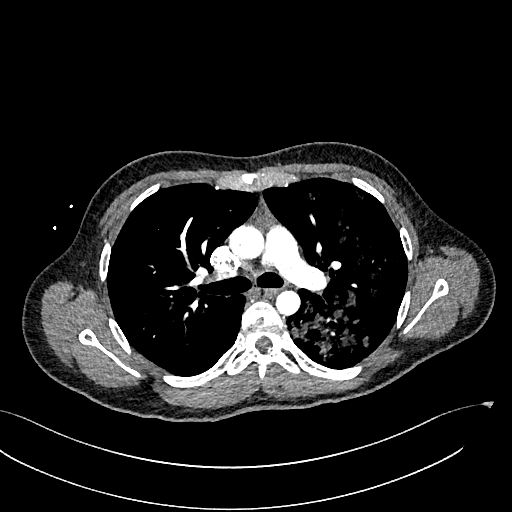
[im 323/462  lung]
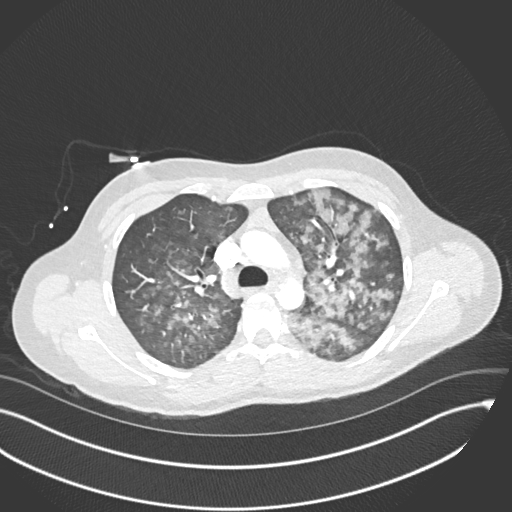
[im 346/462  mediastinal]
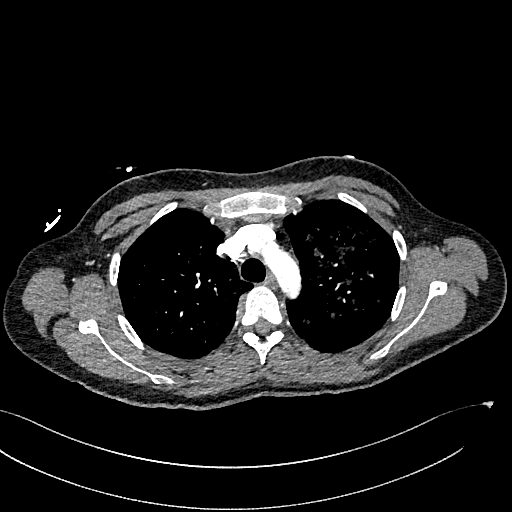
[im 369/462  lung]
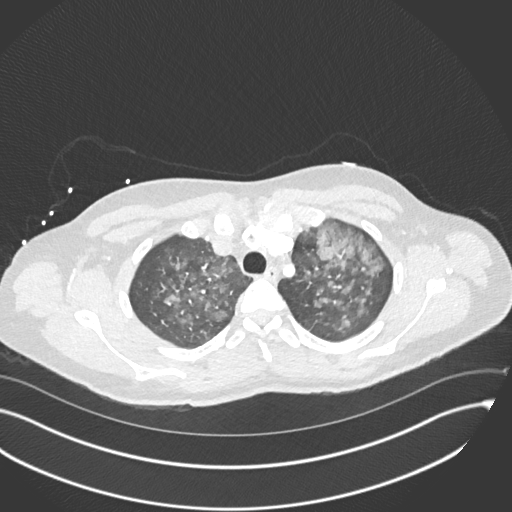
[im 392/462  mediastinal]
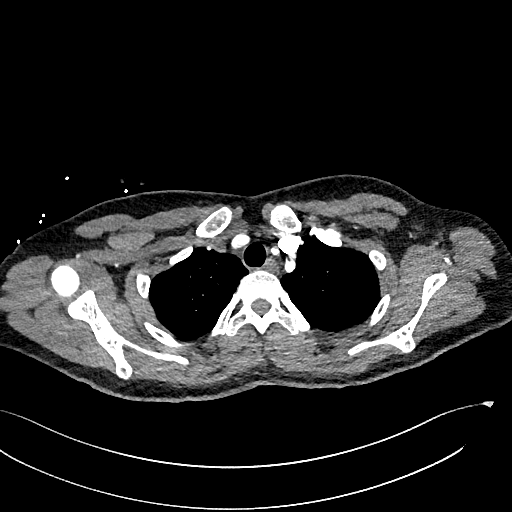
[im 415/462  lung]
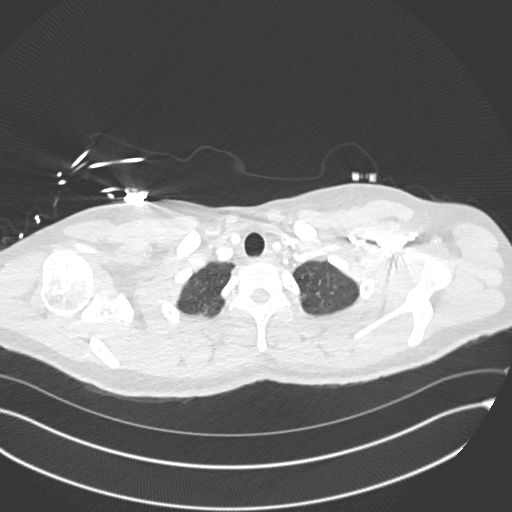
[im 438/462  mediastinal]
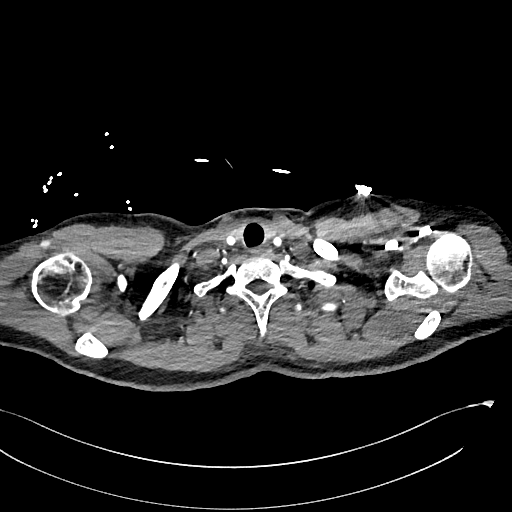

[Series 8: pe 2mm cor · coronal · 0.63mm/px · 1 of 123 slices shown]
[im 62/123  mediastinal]
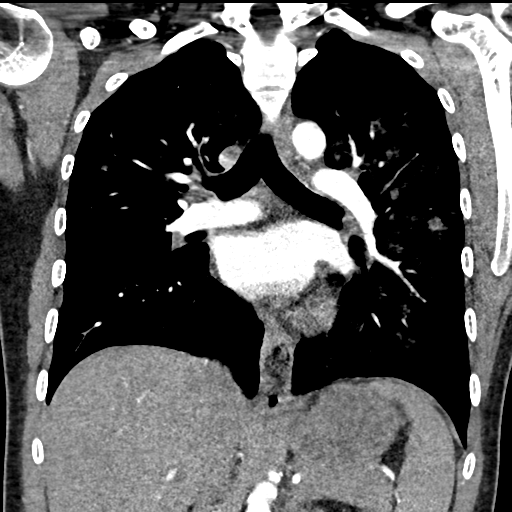

[19 of 36 positions shown; findings below may reference images not displayed]

FINDINGS: Cardiovascular: Satisfactory opacification of the pulmonary arteries
to the segmental level. No evidence of pulmonary embolism. Normal
heart size. No pericardial effusion.

Mediastinum/Nodes: Choose

Lungs/Pleura: Nodular ground-glass opacities throughout bilateral
lungs bilaterally which may reflect alveolitis versus pulmonary
hemorrhage versus less likely pulmonary edema. No pleural effusion
or pneumothorax.

Upper Abdomen: No acute abnormality.

Musculoskeletal: No chest wall abnormality. No acute or significant
osseous findings.

Review of the MIP images confirms the above findings.
IMPRESSION: 1. Nodular ground-glass opacities throughout bilateral lungs
bilaterally which may reflect alveolitis versus pulmonary hemorrhage
versus less likely pulmonary edema.

## 2020-06-14 IMAGING — DX DG CHEST 1V PORT SAME DAY
1 series · 1 of 1 positions shown · non-contrast
Comparison: 09/18/2018, 09/17/2018

CLINICAL DATA: Cough and shortness of breath with hemoptysis
following heroin use

EXAM:
PORTABLE CHEST 1 VIEW

[chest]
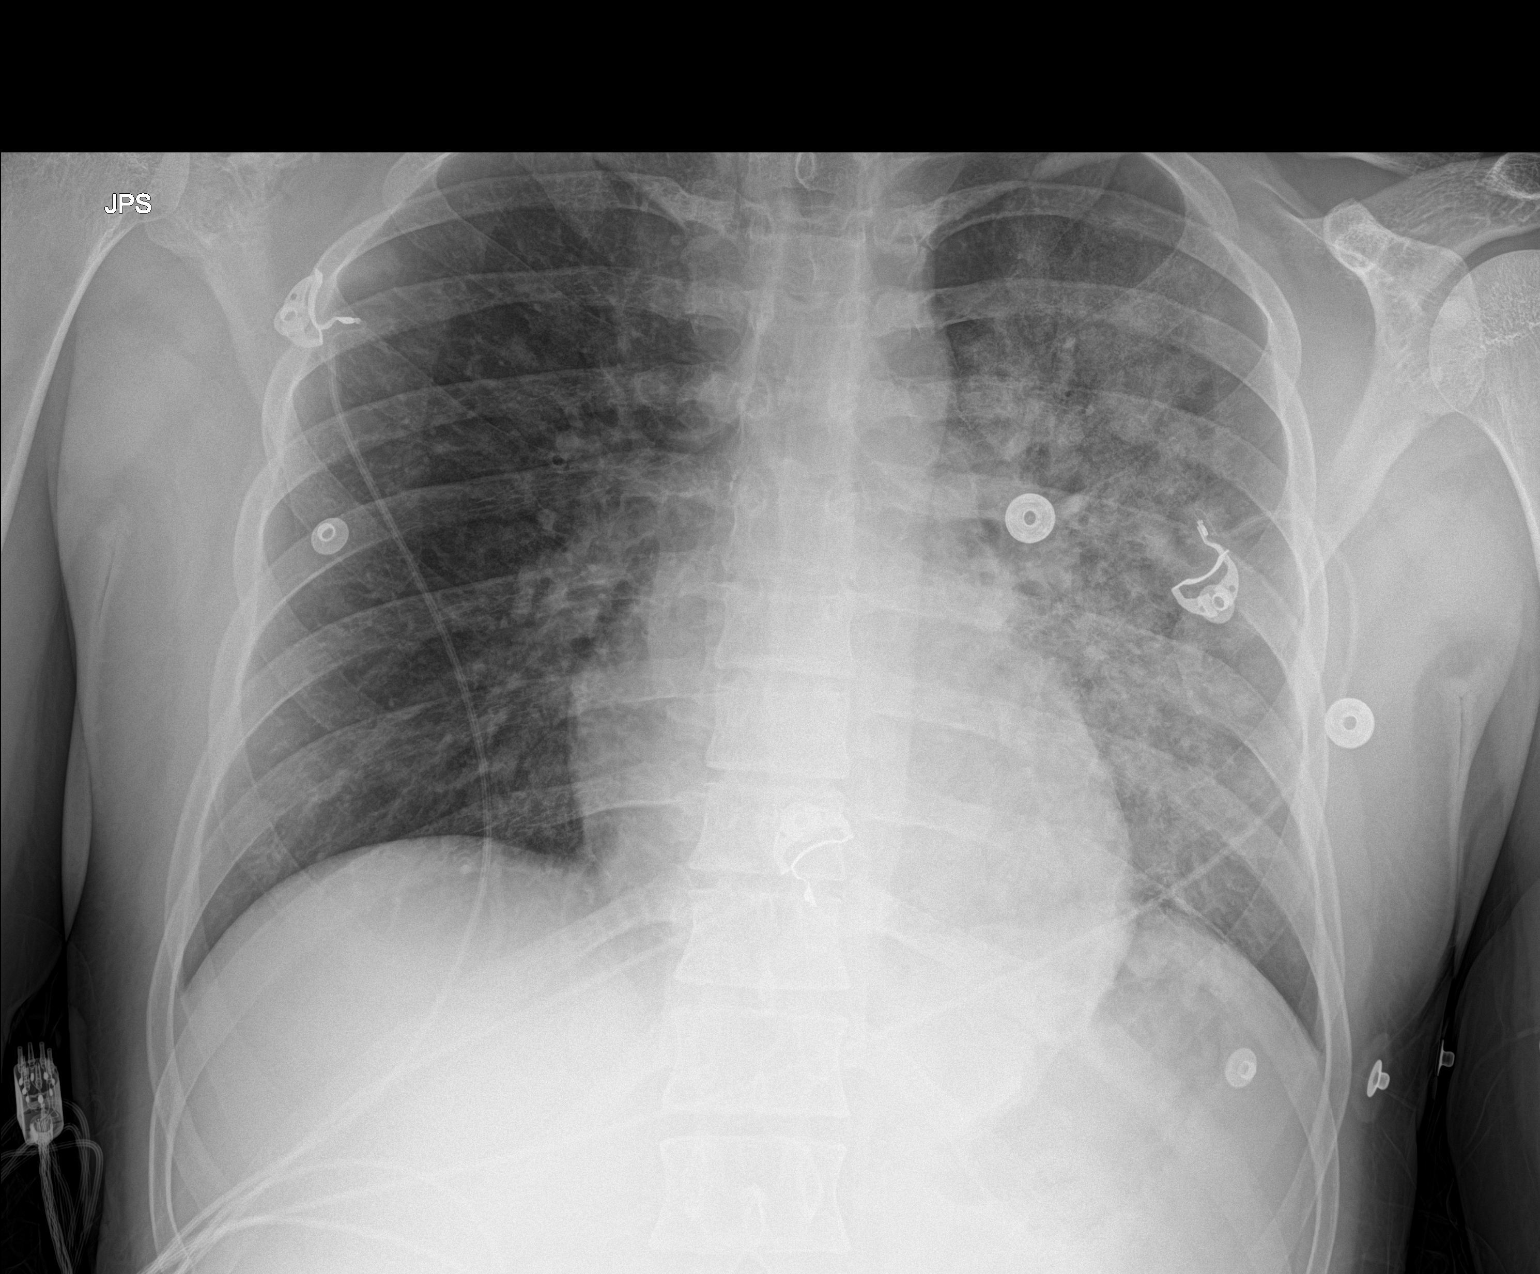

[1 of 1 positions shown; findings below may reference images not displayed]

FINDINGS: Cardiac shadow is stable. Diffuse alveolar opacities are again
identified left greater than right similar to that seen on the
previous exams. No new focal infiltrate, effusion or pneumothorax is
noted. No bony abnormality is seen.
IMPRESSION: Stable alveolar opacities bilaterally left greater than right.
